# Patient Record
Sex: Male | Born: 1945 | Race: White | Hispanic: No | Marital: Married | Smoking: Never smoker
Health system: Southern US, Community
[De-identification: ages and names within clinical notes are randomized; demographics above are authoritative.]

## PROBLEM LIST (undated history)

## (undated) DIAGNOSIS — E785 Hyperlipidemia, unspecified: Secondary | ICD-10-CM

## (undated) DIAGNOSIS — I639 Cerebral infarction, unspecified: Secondary | ICD-10-CM

## (undated) DIAGNOSIS — I10 Essential (primary) hypertension: Secondary | ICD-10-CM

## (undated) DIAGNOSIS — D4102 Neoplasm of uncertain behavior of left kidney: Secondary | ICD-10-CM

## (undated) DIAGNOSIS — M109 Gout, unspecified: Secondary | ICD-10-CM

## (undated) DIAGNOSIS — N289 Disorder of kidney and ureter, unspecified: Secondary | ICD-10-CM

## (undated) DIAGNOSIS — F329 Major depressive disorder, single episode, unspecified: Secondary | ICD-10-CM

## (undated) HISTORY — PX: KIDNEY SURGERY: SHX687

---

## 2011-09-09 ENCOUNTER — Other Ambulatory Visit: Payer: Self-pay | Admitting: Family Medicine

## 2011-09-09 DIAGNOSIS — R1011 Right upper quadrant pain: Secondary | ICD-10-CM

## 2011-09-13 ENCOUNTER — Other Ambulatory Visit: Payer: Self-pay

## 2011-09-16 ENCOUNTER — Ambulatory Visit
Admission: RE | Admit: 2011-09-16 | Discharge: 2011-09-16 | Disposition: A | Discharge status: Home / Self Care | Payer: Medicare Other | Source: Ambulatory Visit | Attending: Family Medicine | Admitting: Family Medicine

## 2011-09-16 DIAGNOSIS — R1011 Right upper quadrant pain: Secondary | ICD-10-CM

## 2015-01-14 ENCOUNTER — Other Ambulatory Visit (HOSPITAL_COMMUNITY): Payer: Self-pay | Admitting: Family Medicine

## 2015-01-14 ENCOUNTER — Ambulatory Visit (HOSPITAL_COMMUNITY)
Admission: RE | Admit: 2015-01-14 | Discharge: 2015-01-14 | Disposition: A | Discharge status: Home / Self Care | Payer: Medicare Other | Source: Ambulatory Visit | Attending: Family Medicine | Admitting: Family Medicine

## 2015-01-14 DIAGNOSIS — R52 Pain, unspecified: Secondary | ICD-10-CM | POA: Diagnosis not present

## 2015-01-14 DIAGNOSIS — M79661 Pain in right lower leg: Secondary | ICD-10-CM | POA: Insufficient documentation

## 2015-01-14 NOTE — Progress Notes (Signed)
Preliminary results by tech - Right Lower Ext. Venous Duplex Completed. Negative for deep and superficial vein thrombosis in the right leg.  Britnie Colville, BS, RDMS, RVT  

## 2015-04-10 ENCOUNTER — Other Ambulatory Visit: Payer: Self-pay | Admitting: Family Medicine

## 2015-04-10 DIAGNOSIS — R1084 Generalized abdominal pain: Secondary | ICD-10-CM

## 2015-04-28 ENCOUNTER — Ambulatory Visit
Admission: RE | Admit: 2015-04-28 | Discharge: 2015-04-28 | Disposition: A | Discharge status: Home / Self Care | Payer: Medicare Other | Source: Ambulatory Visit | Attending: Family Medicine | Admitting: Family Medicine

## 2015-04-28 DIAGNOSIS — R1084 Generalized abdominal pain: Secondary | ICD-10-CM

## 2015-07-02 ENCOUNTER — Ambulatory Visit
Admission: RE | Admit: 2015-07-02 | Discharge: 2015-07-02 | Disposition: A | Discharge status: Home / Self Care | Payer: Medicare Other | Source: Ambulatory Visit | Attending: Family Medicine | Admitting: Family Medicine

## 2015-07-02 ENCOUNTER — Other Ambulatory Visit: Payer: Self-pay | Admitting: Family Medicine

## 2015-07-02 DIAGNOSIS — R52 Pain, unspecified: Secondary | ICD-10-CM

## 2015-07-02 DIAGNOSIS — M5430 Sciatica, unspecified side: Secondary | ICD-10-CM

## 2015-08-04 ENCOUNTER — Other Ambulatory Visit: Payer: Self-pay | Admitting: Family Medicine

## 2015-08-04 DIAGNOSIS — M5430 Sciatica, unspecified side: Secondary | ICD-10-CM

## 2015-08-12 ENCOUNTER — Ambulatory Visit
Admission: RE | Admit: 2015-08-12 | Discharge: 2015-08-12 | Disposition: A | Discharge status: Home / Self Care | Payer: Medicare Other | Source: Ambulatory Visit | Attending: Family Medicine | Admitting: Family Medicine

## 2015-08-12 DIAGNOSIS — M5430 Sciatica, unspecified side: Secondary | ICD-10-CM

## 2016-01-21 ENCOUNTER — Encounter (HOSPITAL_COMMUNITY): Payer: Self-pay | Admitting: Emergency Medicine

## 2016-01-21 ENCOUNTER — Emergency Department (HOSPITAL_COMMUNITY)
Admission: EM | Admit: 2016-01-21 | Discharge: 2016-01-21 | Disposition: A | Discharge status: Home / Self Care | Payer: Medicare Other | Attending: Dermatology | Admitting: Dermatology

## 2016-01-21 DIAGNOSIS — Y939 Activity, unspecified: Secondary | ICD-10-CM | POA: Insufficient documentation

## 2016-01-21 DIAGNOSIS — Z5321 Procedure and treatment not carried out due to patient leaving prior to being seen by health care provider: Secondary | ICD-10-CM | POA: Insufficient documentation

## 2016-01-21 DIAGNOSIS — Y999 Unspecified external cause status: Secondary | ICD-10-CM | POA: Insufficient documentation

## 2016-01-21 DIAGNOSIS — I10 Essential (primary) hypertension: Secondary | ICD-10-CM | POA: Insufficient documentation

## 2016-01-21 DIAGNOSIS — T24031A Burn of unspecified degree of right lower leg, initial encounter: Secondary | ICD-10-CM | POA: Diagnosis not present

## 2016-01-21 DIAGNOSIS — X19XXXA Contact with other heat and hot substances, initial encounter: Secondary | ICD-10-CM | POA: Insufficient documentation

## 2016-01-21 DIAGNOSIS — Y92007 Garden or yard of unspecified non-institutional (private) residence as the place of occurrence of the external cause: Secondary | ICD-10-CM | POA: Diagnosis not present

## 2016-01-21 HISTORY — DX: Essential (primary) hypertension: I10

## 2016-01-21 NOTE — ED Notes (Signed)
Attempted to call patient for assigned room with no answer.  
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
## 2018-10-20 ENCOUNTER — Encounter (HOSPITAL_COMMUNITY): Payer: Self-pay | Admitting: Emergency Medicine

## 2018-10-20 ENCOUNTER — Observation Stay (HOSPITAL_COMMUNITY)
Admission: EM | Admit: 2018-10-20 | Discharge: 2018-10-22 | Disposition: A | Discharge status: Home / Self Care | Payer: Medicare Other | Attending: Family Medicine | Admitting: Family Medicine

## 2018-10-20 ENCOUNTER — Emergency Department (HOSPITAL_COMMUNITY): Payer: Medicare Other

## 2018-10-20 ENCOUNTER — Other Ambulatory Visit: Payer: Self-pay

## 2018-10-20 DIAGNOSIS — Z20828 Contact with and (suspected) exposure to other viral communicable diseases: Secondary | ICD-10-CM | POA: Insufficient documentation

## 2018-10-20 DIAGNOSIS — R29810 Facial weakness: Secondary | ICD-10-CM | POA: Diagnosis present

## 2018-10-20 DIAGNOSIS — I160 Hypertensive urgency: Secondary | ICD-10-CM | POA: Diagnosis present

## 2018-10-20 DIAGNOSIS — Z79899 Other long term (current) drug therapy: Secondary | ICD-10-CM | POA: Insufficient documentation

## 2018-10-20 DIAGNOSIS — I10 Essential (primary) hypertension: Secondary | ICD-10-CM | POA: Insufficient documentation

## 2018-10-20 DIAGNOSIS — I639 Cerebral infarction, unspecified: Secondary | ICD-10-CM | POA: Diagnosis present

## 2018-10-20 DIAGNOSIS — I6350 Cerebral infarction due to unspecified occlusion or stenosis of unspecified cerebral artery: Secondary | ICD-10-CM | POA: Diagnosis not present

## 2018-10-20 DIAGNOSIS — R2990 Unspecified symptoms and signs involving the nervous system: Secondary | ICD-10-CM

## 2018-10-20 LAB — CBC WITH DIFFERENTIAL/PLATELET
Abs Immature Granulocytes: 0.05 10*3/uL (ref 0.00–0.07)
Abs Immature Granulocytes: 0.07 10*3/uL (ref 0.00–0.07)
Basophils Absolute: 0.1 10*3/uL (ref 0.0–0.1)
Basophils Absolute: 0.1 10*3/uL (ref 0.0–0.1)
Basophils Relative: 1 %
Basophils Relative: 1 %
Eosinophils Absolute: 0.3 10*3/uL (ref 0.0–0.5)
Eosinophils Absolute: 0.3 10*3/uL (ref 0.0–0.5)
Eosinophils Relative: 3 %
Eosinophils Relative: 3 %
HCT: 48.4 % (ref 39.0–52.0)
HCT: 48.8 % (ref 39.0–52.0)
Hemoglobin: 16.3 g/dL (ref 13.0–17.0)
Hemoglobin: 16.4 g/dL (ref 13.0–17.0)
Immature Granulocytes: 1 %
Immature Granulocytes: 1 %
Lymphocytes Relative: 23 %
Lymphocytes Relative: 23 %
Lymphs Abs: 2.1 10*3/uL (ref 0.7–4.0)
Lymphs Abs: 2.1 10*3/uL (ref 0.7–4.0)
MCH: 30.1 pg (ref 26.0–34.0)
MCH: 30.4 pg (ref 26.0–34.0)
MCHC: 33.6 g/dL (ref 30.0–36.0)
MCHC: 33.7 g/dL (ref 30.0–36.0)
MCV: 89.3 fL (ref 80.0–100.0)
MCV: 90.4 fL (ref 80.0–100.0)
Monocytes Absolute: 0.7 10*3/uL (ref 0.1–1.0)
Monocytes Absolute: 0.8 10*3/uL (ref 0.1–1.0)
Monocytes Relative: 8 %
Monocytes Relative: 8 %
Neutro Abs: 5.7 10*3/uL (ref 1.7–7.7)
Neutro Abs: 5.9 10*3/uL (ref 1.7–7.7)
Neutrophils Relative %: 64 %
Neutrophils Relative %: 64 %
Platelets: 306 10*3/uL (ref 150–400)
Platelets: 313 10*3/uL (ref 150–400)
RBC: 5.4 MIL/uL (ref 4.22–5.81)
RBC: 5.42 MIL/uL (ref 4.22–5.81)
RDW: 13.4 % (ref 11.5–15.5)
RDW: 13.6 % (ref 11.5–15.5)
WBC: 8.9 10*3/uL (ref 4.0–10.5)
WBC: 9.2 10*3/uL (ref 4.0–10.5)
nRBC: 0 % (ref 0.0–0.2)
nRBC: 0 % (ref 0.0–0.2)

## 2018-10-20 LAB — ETHANOL: Alcohol, Ethyl (B): <10 mg/dL (ref <10)

## 2018-10-20 LAB — URINALYSIS, ROUTINE W REFLEX MICROSCOPIC
Bacteria, UA: NONE SEEN
Bilirubin Urine: NEGATIVE
Glucose, UA: NEGATIVE mg/dL
Hgb urine dipstick: NEGATIVE
Ketones, ur: NEGATIVE mg/dL
Leukocytes,Ua: NEGATIVE
Nitrite: NEGATIVE
Protein, ur: 100 mg/dL — AB
Specific Gravity, Urine: 1.02 (ref 1.005–1.030)
pH: 5 (ref 5.0–8.0)

## 2018-10-20 LAB — BASIC METABOLIC PANEL
Anion gap: 10 (ref 5–15)
Anion gap: 10 (ref 5–15)
BUN: 18 mg/dL (ref 8–23)
BUN: 19 mg/dL (ref 8–23)
CO2: 24 mmol/L (ref 22–32)
CO2: 24 mmol/L (ref 22–32)
Calcium: 9.3 mg/dL (ref 8.9–10.3)
Calcium: 9.4 mg/dL (ref 8.9–10.3)
Chloride: 103 mmol/L (ref 98–111)
Chloride: 103 mmol/L (ref 98–111)
Creatinine, Ser: 1.38 mg/dL — ABNORMAL HIGH (ref 0.61–1.24)
Creatinine, Ser: 1.58 mg/dL — ABNORMAL HIGH (ref 0.61–1.24)
GFR calc Af Amer: 49 mL/min — ABNORMAL LOW (ref >60)
GFR calc Af Amer: 58 mL/min — ABNORMAL LOW (ref >60)
GFR calc non Af Amer: 42 mL/min — ABNORMAL LOW (ref >60)
GFR calc non Af Amer: 50 mL/min — ABNORMAL LOW (ref >60)
Glucose, Bld: 91 mg/dL (ref 70–99)
Glucose, Bld: 94 mg/dL (ref 70–99)
Potassium: 4.5 mmol/L (ref 3.5–5.1)
Potassium: 4.7 mmol/L (ref 3.5–5.1)
Sodium: 137 mmol/L (ref 135–145)
Sodium: 137 mmol/L (ref 135–145)

## 2018-10-20 LAB — MAGNESIUM
Magnesium: 2.3 mg/dL (ref 1.7–2.4)
Magnesium: 2.3 mg/dL (ref 1.7–2.4)

## 2018-10-20 MED ORDER — ASPIRIN 81 MG PO CHEW
324.0000 mg | CHEWABLE_TABLET | Freq: Once | ORAL | Status: AC
  Administered 2018-10-20: 20:00:00 324 mg via ORAL
  Filled 2018-10-20: qty 4

## 2018-10-20 NOTE — ED Notes (Signed)
Spoke with Santiago Glad, Agricultural consultant at [employer]. Pt will be transported to [facility] for MRI. Dr. Wilson Singer spoke with Dr. Gilford Raid as accepting.
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
## 2018-10-20 NOTE — ED Notes (Addendum)
Pt states he started feeling confused at 0730 this morning. Headache and facial numbness began Monday, however, pt also states he thought today was Monday.

## 2018-10-20 NOTE — ED Notes (Signed)
ED Provider has been made aware of patient symptoms.

## 2018-10-20 NOTE — ED Provider Notes (Signed)
[place] DEPT Provider Note   CSN: [id number] Arrival date & time: 10/20/18  1627     History   Chief Complaint Chief Complaint  Patient presents with  . Facial Droop    HPI  Ristine is a 73 y.o. male.     HPI   43yM with stroke like symptoms. L facial droop, intermittent LUE numbness and intermittent difficulty with recall. Unclear exact symptom onset. Differing times provided by both patient and his wife. Possibly as long as two weeks ago he noticed abnormal sensation in L face. He didn't feel like it was anything that needed evaluation though. Resolved. Symptoms again a couple days later but is vague on exact timeline. Also then noticed numbness in L arm down through his fingers. Maybe some mild L arm weakness. Wife has noticed that he has seemed mildly confused and needing frequent reorientation to the date in the past week. This is unusual for him. She has also noticed that L face has been drooping. Trying to pin times down is difficult for either of them. No change in voice. No acute visual changes. No problems with balance. Has been having intermittent frontal headaches in recently. Past hx of HTN.    Past Medical History:  Diagnosis Date  . Hypertension    There are no active problems to display for this patient.  Past Surgical History:  Procedure Laterality Date  . KIDNEY SURGERY        Home Medications    Prior to Admission medications   Medication Sig Start Date End Date Taking? Authorizing Provider  colchicine 0.6 MG tablet Take 0.6 mg by mouth daily as needed (gout).   Yes [provider]  metoprolol succinate (TOPROL-XL) 100 MG 24 hr tablet Take 100 mg by mouth daily. 07/21/18  Yes [provider]    Family History No family history on file.  Social History Social History   Tobacco Use  . Smoking status: Never Smoker  . Smokeless tobacco: Never Used  Substance Use Topics  . Alcohol use: Not on  file  . Drug use: Not on file    Allergies   Prednisone and Keflex [cephalexin]   Review of Systems Review of Systems All systems reviewed and negative, other than as noted in HPI.   Physical Exam Updated Vital Signs BP (!) 169/120   Pulse 68   Temp 98 F (36.7 C) (Oral)   Resp 16   SpO2 97%   Physical Exam Vitals signs and nursing note reviewed.  Constitutional:      General: He is not in acute distress.    Appearance: He is well-developed.  HENT:     Head: Normocephalic and atraumatic.  Eyes:     General:        Right eye: No discharge.        Left eye: No discharge.     Conjunctiva/sclera: Conjunctivae normal.  Neck:     Musculoskeletal: Neck supple.  Cardiovascular:     Rate and Rhythm: Normal rate and regular rhythm.     Heart sounds: Normal heart sounds. No murmur. No friction rub. No gallop.   Pulmonary:     Effort: Pulmonary effort is normal. No respiratory distress.     Breath sounds: Normal breath sounds.  Abdominal:     General: There is no distension.     Palpations: Abdomen is soft.     Tenderness: There is no abdominal tenderness.  Musculoskeletal:  General: No tenderness.  Skin:    General: Skin is warm and dry.  Neurological:     Mental Status: He is alert.     Comments: Speech somewhat slow, but clear. AOx3. L facial droop. CN 2-12 otherwise intact. Strength 5/5 b/l u/l ext.   Psychiatric:        Behavior: Behavior normal.        Thought Content: Thought content normal.    ED Treatments / Results  Labs (all labs ordered are listed, but only abnormal results are displayed) Labs Reviewed  BASIC METABOLIC PANEL - Abnormal; Notable for the following components:      Result Value   Creatinine, Ser 1.58 (*)    GFR calc non Af Amer 42 (*)    GFR calc Af Amer 49 (*)    All other components within normal limits  URINALYSIS, ROUTINE W REFLEX MICROSCOPIC - Abnormal; Notable for the following components:   Protein, ur 100 (*)    All  other components within normal limits  BASIC METABOLIC PANEL - Abnormal; Notable for the following components:   Creatinine, Ser 1.38 (*)    GFR calc non Af Amer 50 (*)    GFR calc Af Amer 58 (*)    All other components within normal limits  CBC WITH DIFFERENTIAL/PLATELET  MAGNESIUM  ETHANOL  CBC WITH DIFFERENTIAL/PLATELET  MAGNESIUM    EKG EKG Interpretation  Date/Time:  Friday October 20 2018 16:54:03 EDT Ventricular Rate:  71 PR Interval:    QRS Duration: 91 QT Interval:  412 QTC Calculation: 448 R Axis:   -27 Text Interpretation:  Sinus rhythm Probable left atrial enlargement  left axis deviation Abnormal R-wave progression, early transition Repol abnrm suggests ischemia, lateral leads No old tracing to compare Confirmed by Virgel Manifold [phone]) on 10/20/2018 8:05:33 PM   Radiology Ct Head Wo Contrast  Result Date: 10/20/2018 CLINICAL DATA:  73 year old male with headache facial numbness for 4 days. Confusion for 1 day. EXAM: CT HEAD WITHOUT CONTRAST TECHNIQUE: Contiguous axial images were obtained from the base of the skull through the vertex without intravenous contrast. COMPARISON:  None. FINDINGS: Brain: An age indeterminate infarct within the RIGHT basal ganglia/genu of the internal capsule may be subacute. There is no evidence of hemorrhage. 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 There is no evidence of midline shift, mass, extra-axial collection or hydrocephalus. Vascular: Atherosclerotic calcifications noted. Skull: Normal. Negative for fracture or focal lesion. Sinuses/Orbits: No acute finding. Other: None. IMPRESSION: 1. Age indeterminate RIGHT basal ganglia/internal capsule which may be subacute. No evidence of hemorrhage. 2. 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 Electronically Signed   By: Margarette Canada M.D.   On:  10/20/2018 18:44    Procedures Procedures (including critical care time)  Medications Ordered in ED Medications - No data to display   Initial Impression / Assessment and Plan / ED Course  I have reviewed the triage vital signs and the nursing notes.  Pertinent labs & imaging results that were available during my care of the patient were reviewed by me and considered in my medical decision making (see chart for details).     CT equivocal for old versus subacute infarct. Pt cannot give clear timeline. Discussed with neurology. Cannot obtainn MRI at [facility] unfortunately. Transfer to [facility] ED for MR. If positive for acute CVA then consult neurology. If doesn't, then outpt studies  and neurology FU. Discussed with Dr Gilford Raid. A daughter now at bedside. She/pt updated on plan. No new complaints.   Final Clinical Impressions(s) / ED Diagnoses   Final diagnoses:  Stroke-like symptoms    ED Discharge Orders    None       Virgel Manifold, MD 10/20/18 2006

## 2018-10-21 ENCOUNTER — Observation Stay (HOSPITAL_COMMUNITY): Payer: Medicare Other

## 2018-10-21 ENCOUNTER — Observation Stay (HOSPITAL_BASED_OUTPATIENT_CLINIC_OR_DEPARTMENT_OTHER): Payer: Medicare Other

## 2018-10-21 ENCOUNTER — Emergency Department (HOSPITAL_COMMUNITY): Payer: Medicare Other

## 2018-10-21 ENCOUNTER — Encounter (HOSPITAL_COMMUNITY): Payer: Self-pay | Admitting: Internal Medicine

## 2018-10-21 DIAGNOSIS — Z79899 Other long term (current) drug therapy: Secondary | ICD-10-CM | POA: Diagnosis not present

## 2018-10-21 DIAGNOSIS — I639 Cerebral infarction, unspecified: Secondary | ICD-10-CM

## 2018-10-21 DIAGNOSIS — R29810 Facial weakness: Secondary | ICD-10-CM | POA: Diagnosis present

## 2018-10-21 DIAGNOSIS — I160 Hypertensive urgency: Secondary | ICD-10-CM

## 2018-10-21 DIAGNOSIS — Z20828 Contact with and (suspected) exposure to other viral communicable diseases: Secondary | ICD-10-CM | POA: Diagnosis not present

## 2018-10-21 DIAGNOSIS — I10 Essential (primary) hypertension: Secondary | ICD-10-CM | POA: Diagnosis not present

## 2018-10-21 DIAGNOSIS — I6350 Cerebral infarction due to unspecified occlusion or stenosis of unspecified cerebral artery: Secondary | ICD-10-CM | POA: Diagnosis not present

## 2018-10-21 LAB — COMPREHENSIVE METABOLIC PANEL
ALT: 18 U/L (ref 0–44)
AST: 22 U/L (ref 15–41)
Albumin: 3.4 g/dL — ABNORMAL LOW (ref 3.5–5.0)
Alkaline Phosphatase: 76 U/L (ref 38–126)
Anion gap: 13 (ref 5–15)
BUN: 16 mg/dL (ref 8–23)
CO2: 22 mmol/L (ref 22–32)
Calcium: 8.7 mg/dL — ABNORMAL LOW (ref 8.9–10.3)
Chloride: 102 mmol/L (ref 98–111)
Creatinine, Ser: 1.39 mg/dL — ABNORMAL HIGH (ref 0.61–1.24)
GFR calc Af Amer: 57 mL/min — ABNORMAL LOW (ref >60)
GFR calc non Af Amer: 50 mL/min — ABNORMAL LOW (ref >60)
Glucose, Bld: 140 mg/dL — ABNORMAL HIGH (ref 70–99)
Potassium: 3.6 mmol/L (ref 3.5–5.1)
Sodium: 137 mmol/L (ref 135–145)
Total Bilirubin: 0.9 mg/dL (ref 0.3–1.2)
Total Protein: 5.8 g/dL — ABNORMAL LOW (ref 6.5–8.1)

## 2018-10-21 LAB — CBC
HCT: 43.7 % (ref 39.0–52.0)
Hemoglobin: 14.9 g/dL (ref 13.0–17.0)
MCH: 30.2 pg (ref 26.0–34.0)
MCHC: 34.1 g/dL (ref 30.0–36.0)
MCV: 88.5 fL (ref 80.0–100.0)
Platelets: 257 10*3/uL (ref 150–400)
RBC: 4.94 MIL/uL (ref 4.22–5.81)
RDW: 13.4 % (ref 11.5–15.5)
WBC: 7.7 10*3/uL (ref 4.0–10.5)
nRBC: 0 % (ref 0.0–0.2)

## 2018-10-21 LAB — ECHOCARDIOGRAM COMPLETE
Height: 66 in
Weight: 3039.98 oz

## 2018-10-21 LAB — HEMOGLOBIN A1C
Hgb A1c MFr Bld: 5.1 % (ref 4.8–5.6)
Mean Plasma Glucose: 99.67 mg/dL

## 2018-10-21 LAB — SARS CORONAVIRUS 2 (TAT 6-24 HRS): SARS Coronavirus 2: NEGATIVE

## 2018-10-21 LAB — LIPID PANEL
Cholesterol: 172 mg/dL (ref 0–200)
HDL: 36 mg/dL — ABNORMAL LOW (ref >40)
LDL Cholesterol: 116 mg/dL — ABNORMAL HIGH (ref 0–99)
Total CHOL/HDL Ratio: 4.8 RATIO
Triglycerides: 101 mg/dL (ref <150)
VLDL: 20 mg/dL (ref 0–40)

## 2018-10-21 LAB — TROPONIN I (HIGH SENSITIVITY): Troponin I (High Sensitivity): 7 ng/L (ref <18)

## 2018-10-21 LAB — SEDIMENTATION RATE: Sed Rate: 3 mm/hr (ref 0–16)

## 2018-10-21 MED ORDER — HYDRALAZINE HCL 20 MG/ML IJ SOLN
10.0000 mg | INTRAMUSCULAR | Status: DC | PRN
  Administered 2018-10-21: 10 mg via INTRAVENOUS
  Filled 2018-10-21: qty 1

## 2018-10-21 MED ORDER — STROKE: EARLY STAGES OF RECOVERY BOOK
Freq: Once | Status: DC
  Filled 2018-10-21 (×2): qty 1

## 2018-10-21 MED ORDER — ASPIRIN 325 MG PO TABS
325.0000 mg | ORAL_TABLET | Freq: Every day | ORAL | Status: DC
  Administered 2018-10-21 – 2018-10-22 (×2): 325 mg via ORAL
  Filled 2018-10-21 (×2): qty 1

## 2018-10-21 MED ORDER — METOPROLOL SUCCINATE ER 100 MG PO TB24
100.0000 mg | ORAL_TABLET | Freq: Every day | ORAL | Status: DC
  Administered 2018-10-21: 100 mg via ORAL
  Filled 2018-10-21: qty 1

## 2018-10-21 MED ORDER — ACETAMINOPHEN 160 MG/5ML PO SOLN: 650.0000 mg | ORAL | Status: DC | PRN

## 2018-10-21 MED ORDER — ASPIRIN 300 MG RE SUPP: 300.0000 mg | Freq: Every day | RECTAL | Status: DC

## 2018-10-21 MED ORDER — SODIUM CHLORIDE 0.9 % IV SOLN: INTRAVENOUS | Status: AC

## 2018-10-21 MED ORDER — ENOXAPARIN SODIUM 40 MG/0.4ML ~~LOC~~ SOLN
40.0000 mg | Freq: Every day | SUBCUTANEOUS | Status: DC
  Administered 2018-10-21 – 2018-10-22 (×2): 40 mg via SUBCUTANEOUS
  Filled 2018-10-21 (×2): qty 0.4

## 2018-10-21 MED ORDER — ACETAMINOPHEN 650 MG RE SUPP: 650.0000 mg | RECTAL | Status: DC | PRN

## 2018-10-21 MED ORDER — ATORVASTATIN CALCIUM 80 MG PO TABS
80.0000 mg | ORAL_TABLET | Freq: Every day | ORAL | Status: DC
  Administered 2018-10-21: 80 mg via ORAL
  Filled 2018-10-21: qty 1

## 2018-10-21 MED ORDER — CARVEDILOL 12.5 MG PO TABS
12.5000 mg | ORAL_TABLET | Freq: Two times a day (BID) | ORAL | Status: DC
  Administered 2018-10-21 – 2018-10-22 (×2): 12.5 mg via ORAL
  Filled 2018-10-21 (×2): qty 1

## 2018-10-21 MED ORDER — ATORVASTATIN CALCIUM 40 MG PO TABS: 40.0000 mg | ORAL_TABLET | Freq: Every day | ORAL | Status: DC

## 2018-10-21 MED ORDER — ACETAMINOPHEN 325 MG PO TABS
650.0000 mg | ORAL_TABLET | ORAL | Status: DC | PRN
  Administered 2018-10-21: 650 mg via ORAL
  Filled 2018-10-21: qty 2

## 2018-10-21 MED ORDER — AMLODIPINE BESYLATE 2.5 MG PO TABS
2.5000 mg | ORAL_TABLET | Freq: Every day | ORAL | Status: DC
  Administered 2018-10-21 – 2018-10-22 (×2): 2.5 mg via ORAL
  Filled 2018-10-21 (×2): qty 1

## 2018-10-21 NOTE — Evaluation (Signed)
Physical Therapy Evaluation Patient Details Name: John Joseph MRN: [id number] DOB: 29-Aug-1945 Today's Date: 10/21/2018   History of Present Illness   John Joseph is a 73 y.o. male with history of hypertension was brought to the ER after patient's wife noticed that patient was having left facial droop.  Patient states over the last 2 weeks he has been having frontal headache and increased confusion. 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   Clinical Impression  Pt admitted with above diagnosis. Pt reports that the L sided HA he has had for past month is resolved and he feels more alert than he was PTA. Pt independent with bed mobility and transfers, ambulated without LOB, but shows decreased awareness of deficits as well as getting confused directionally and needing cues to find room. Will follow acutely to work on cognitive tasks in combination with mobility.  Pt currently with functional limitations due to the deficits listed below (see PT Problem List). Pt will benefit from skilled PT to increase their independence and safety with mobility to allow discharge to the venue listed below.       Follow Up Recommendations No PT follow up    Equipment Recommendations  None recommended by PT    Recommendations for Other Services Speech consult     Precautions / Restrictions Precautions Precautions: Fall Restrictions Weight Bearing Restrictions: No      Mobility  Bed Mobility Overal bed mobility: Independent                Transfers Overall transfer level: Modified independent Equipment used: None                Ambulation/Gait Ambulation/Gait assistance: Modified independent (Device/Increase time) Gait Distance (Feet): 300 Feet Assistive device: None Gait Pattern/deviations: WFL(Within Functional Limits) Gait velocity: WFL Gait velocity interpretation: >2.62 ft/sec, indicative of community ambulatory General Gait Details: pt ambulated safely  but is easily distracted by environment and got very turned around on unit.   Stairs Stairs: Yes Stairs assistance: Supervision Stair Management: One rail Right;Alternating pattern;Forwards Number of Stairs: 10 General stair comments: HR 89-99 bpm on stairs, no LOB with use of rail  Wheelchair Mobility    Modified Rankin (Stroke Patients Only) Modified Rankin (Stroke Patients Only) Pre-Morbid Rankin Score: No significant disability Modified Rankin: Slight disability     Balance Overall balance assessment: Mild deficits observed, not formally tested                                           Pertinent Vitals/Pain Pain Assessment: No/denies pain    Home Living Family/patient expects to be discharged to:: Private residence Living Arrangements: Spouse/significant other Available Help at Discharge: Family;Available 24 hours/day Type of Home: House Home Access: Stairs to enter Entrance Stairs-Rails: None Entrance Stairs-Number of Steps: 3 Home Layout: Two level Home Equipment: Wheelchair - manual      Prior Function Level of Independence: Independent         Comments: 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: Right    Extremity/Trunk Assessment   Upper Extremity Assessment Upper Extremity Assessment: Defer to OT evaluation    Lower Extremity Assessment Lower Extremity Assessment: Overall WFL for tasks assessed    Cervical / Trunk Assessment Cervical / Trunk Assessment: Normal  Communication   Communication:  Expressive difficulties  Cognition Arousal/Alertness: Awake/alert Behavior During Therapy: WFL for tasks assessed/performed Overall Cognitive Status: Impaired/Different from baseline Area of Impairment: Memory;Problem solving;Safety/judgement;Following commands;Attention                   Current Attention Level: Sustained Memory: Decreased short-term memory Following  Commands: Follows one step commands consistently;Follows multi-step commands with increased time Safety/Judgement: Decreased awareness of safety;Decreased awareness of deficits   Problem Solving: Slow processing;Difficulty sequencing;Requires verbal cues General Comments: pt repeats self and asks same questions several times, got very disoriented while ambulating on unit with directions needed to get back to room, slow processing noted as well      General Comments General comments (skin integrity, edema, etc.): pt reports frequent L headache past month and increasing "Brain fog" that seems somewhat better today    Exercises     Assessment/Plan    PT Assessment Patient needs continued PT services  PT Problem List Decreased cognition;Decreased safety awareness       PT Treatment Interventions Gait training;Cognitive remediation;Therapeutic exercise;Balance training;Functional mobility training;Stair training;Patient/family education    PT Goals (Current goals can be found in the Care Plan section)  Acute Rehab PT Goals Patient Stated Goal: go home PT Goal Formulation: With patient Time For Goal Achievement: 11/04/18 Potential to Achieve Goals: Good Additional Goals Additional Goal #1: Pt to perform >47 on Berg balance Additional Goal #2: Pt will be able to walk the 3W unit and remember his room number and find his way back to his room independently    Frequency Min 4X/week   Barriers to discharge        Co-evaluation               AM-PAC PT "6 Clicks" Mobility  Outcome Measure Help needed turning from your back to your side while in a flat bed without using bedrails?: None Help needed moving from lying on your back to sitting on the side of a flat bed without using bedrails?: None Help needed moving to and from a bed to a chair (including a wheelchair)?: None Help needed standing up from a chair using your arms (e.g., wheelchair or bedside chair)?: None Help needed  to walk in hospital room?: None Help needed climbing 3-5 steps with a railing? : A Little 6 Click Score: 23    End of Session   Activity Tolerance: Patient tolerated treatment well Patient left: in bed;with call bell/phone within reach Nurse Communication: Mobility status PT Visit Diagnosis: Apraxia (R48.2)    Time: [id number] PT Time Calculation (min) (ACUTE ONLY): 29 min   Charges:   PT Evaluation $PT Eval Moderate Complexity: 1 Mod PT Treatments $Gait Training: 8-22 mins        Leighton Roach, PT  Acute Rehab Services  Pager [phone] Office [place] 10/21/2018, 1:11 PM

## 2018-10-21 NOTE — ED Provider Notes (Signed)
12:51 AM Assumed care from Dr. Wilson Singer, please see their note for full history, physical and decision making until this point. In brief this is a 73 y.o. year old male who presented to the ED tonight with Facial Droop     Apparently new l sided facial droop. Patient states he can't smile unless something is funny but does appear to have mildly depressed left smile.  Per previous provider note, had spoken to neurology and plan for MRI. If acute stroke, admit. If not, dc home with outpatient follow up.  Dr. 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 Will allow perimssive HTN. He will see for formal consult, recommends medicine admission. Will consult.  Labs, studies and imaging reviewed by myself and considered in medical decision making if ordered. Imaging interpreted by radiology.  Labs Reviewed  BASIC METABOLIC PANEL - Abnormal; Notable for the following components:      Result Value   Creatinine, Ser 1.58 (*)    GFR calc non Af Amer 42 (*)    GFR calc Af Amer 49 (*)    All other components within normal limits  URINALYSIS, ROUTINE W REFLEX MICROSCOPIC - Abnormal; Notable for the following components:   Protein, ur 100 (*)    All other components within normal limits  BASIC METABOLIC PANEL - Abnormal; Notable for the following components:   Creatinine, Ser 1.38 (*)    GFR calc non Af Amer 50 (*)    GFR calc Af Amer 58 (*)    All other components within normal limits  CBC WITH DIFFERENTIAL/PLATELET  MAGNESIUM  ETHANOL  CBC WITH DIFFERENTIAL/PLATELET  MAGNESIUM    CT Head Wo Contrast  Final Result    MR BRAIN WO CONTRAST    (Results Pending)    No follow-ups on file.    Eulene Pekar, Corene Cornea, MD 10/21/18 [phone]

## 2018-10-21 NOTE — ED Notes (Signed)
SHARON RILEY (DAUGHTER)- [phone]. STATED SHE IS GOING HOME BUT WOULD LIKE TO BE CALLED FOR UPDATES AND INFORMED WHEN HE IS IN A ROOM.

## 2018-10-21 NOTE — Progress Notes (Signed)
PROGRESS NOTE    John Joseph  [id number] DOB: 1945/09/24 DOA: 10/20/2018 PCP: Jonathon Jordan, MD      Brief Narrative:  John Joseph is a 73 y.o. male with history of hypertension was brought to the ER after patient's wife noticed that patient was having left facial droop.  Patient states over the last 2 weeks he has been having frontal headache and increased confusion.  Yesterday morning the patient's wife noticed that patient had left facial droop and was brought to the ER.  Patient is a poor historian.  Denies any chest pain shortness of breath nausea vomiting diarrhea.  Denies any fever chills.  Denies any weakness of the upper or lower extremities.  Denies any visual symptoms.  ED Course: In the ER patient had mild left facial droop.  CT of the head was abnormal and on-call neurology was consulted and had MRI brain done which shows acute/subacute infarct involving the right basal ganglia area.  Patient passed swallow.  Blood pressure is markedly elevated with systolic in the [id number] when patient arrived.  Labs revealed creatinine of 1.5 no old labs to compare per EKG shows normal sinus rhythm with ST elevation in the lead I.  Discussed with cardiologist and since patient did not have any chest pain or any changes in other leads no further action required for this.   Assessment & Plan:  Stroke -Non-invasive angiography showed no significant atherosclerosis -Echocardiogram pending -Carotid imaging unremarkable   -Lipids ordered: on atorvastatin -Aspirin ordered at admission  -Continue Plavix -Atrial fibrillation: NOt on tele -tPA not given because out of stroke window -Dysphagia screen ordered in ER -PT eval ordered -Smoking cessation: not applicable  Dementia Neurology referral  [facility] Neurology referral  Hypertensive urgency Start amlodipine Change BB to carvedilol     DVT prophylaxis: Lovenox Code Status: FULL Family Communication: Wife MDM and disposition Plan:  This is a no charge note.  For further details, please see H&P by my partner Dr. Hal Hope from earlier today.  The below labs and imaging reports were reviewed and summarized above.    The patient was admitted with stroke    Objective: Vitals:   10/21/18 1100 10/21/18 1200 10/21/18 1400 10/21/18 1600  BP: (!) 180/113 (!) 156/89 (!) 142/99 (!) 157/101  Pulse: 77 97 96   Resp:      Temp: (!) 97 F (36.1 C) 98 F (36.7 C) (!) 97 F (36.1 C) 98 F (36.7 C)  TempSrc: Axillary Axillary Oral Axillary  SpO2: 98%  99% 95%  Weight:      Height:        Intake/Output Summary (Last 24 hours) at 10/21/2018 1855 Last data filed at 10/21/2018 0300 Gross per 24 hour  Intake 240 ml  Output -  Net 240 ml   Filed Weights   10/21/18 0600  Weight: 86.2 kg    Examination: The patient was seen and examined.      Data Reviewed: I have personally reviewed following labs and imaging studies:  CBC: Recent Labs  Lab 10/20/18 1655 10/21/18 0451  WBC 8.9  9.2 7.7  NEUTROABS 5.7  5.9  --   HGB 16.4  16.3 14.9  HCT 48.8  48.4 43.7  MCV 90.4  89.3 88.5  PLT 306  313 [id number]   Basic Metabolic Panel: Recent Labs  Lab 10/20/18 1655 10/21/18 0451  NA 137  137 137  K 4.7  4.5 3.6  CL 103  103 102  CO2 24  24 22  GLUCOSE 94  91 140*  BUN 19  18 16   CREATININE 1.38*  1.58* 1.39*  CALCIUM 9.4  9.3 8.7*  MG 2.3  2.3  --    GFR: Estimated Creatinine Clearance: 48 mL/min (A) (by C-G formula based on SCr of 1.39 mg/dL (H)). Liver Function Tests: Recent Labs  Lab 10/21/18 0451  AST 22  ALT 18  ALKPHOS 76  BILITOT 0.9  PROT 5.8*  ALBUMIN 3.4*   No results for input(s): LIPASE, AMYLASE in the last 168 hours. No results for input(s): AMMONIA in the last 168 hours. Coagulation Profile: No results for input(s): INR, PROTIME in the last 168 hours. Cardiac Enzymes: No results for input(s): CKTOTAL, CKMB, CKMBINDEX, TROPONINI in the last 168 hours. BNP (last 3 results) No  results for input(s): PROBNP in the last 8760 hours. HbA1C: Recent Labs    10/21/18 0451  HGBA1C 5.1   CBG: No results for input(s): GLUCAP in the last 168 hours. Lipid Profile: Recent Labs    10/21/18 0451  CHOL 172  HDL 36*  LDLCALC 116*  TRIG 101  CHOLHDL 4.8   Thyroid Function Tests: No results for input(s): TSH, T4TOTAL, FREET4, T3FREE, THYROIDAB in the last 72 hours. Anemia Panel: No results for input(s): VITAMINB12, FOLATE, FERRITIN, TIBC, IRON, RETICCTPCT in the last 72 hours. Urine analysis:    Component Value Date/Time   COLORURINE YELLOW 10/20/2018 1801   APPEARANCEUR CLEAR 10/20/2018 1801   LABSPEC 1.020 10/20/2018 1801   PHURINE 5.0 10/20/2018 1801   GLUCOSEU NEGATIVE 10/20/2018 1801   HGBUR NEGATIVE 10/20/2018 1801   BILIRUBINUR NEGATIVE 10/20/2018 [place] 10/20/2018 1801   PROTEINUR 100 (A) 10/20/2018 1801   NITRITE NEGATIVE 10/20/2018 1801   LEUKOCYTESUR NEGATIVE 10/20/2018 1801   Sepsis Labs: @LABRCNTIP (procalcitonin:4,lacticacidven:4)  ) Recent Results (from the past 240 hour(s))  SARS CORONAVIRUS 2 (TAT 6-12 HRS) Nasal Swab Aptima Multi Swab     Status: None   Collection Time: 10/21/18  2:43 AM   Specimen: Aptima Multi Swab; Nasal Swab  Result Value Ref Range Status   SARS Coronavirus 2 NEGATIVE NEGATIVE Final    Comment: (NOTE) SARS-CoV-2 target nucleic acids are NOT DETECTED. The SARS-CoV-2 RNA is generally detectable in upper and lower respiratory specimens during the acute phase of infection. Negative results do not preclude SARS-CoV-2 infection, do not rule out co-infections with other pathogens, and should not be used as the sole basis for treatment or other patient management decisions. Negative results must be combined with clinical observations, patient history, and epidemiological information. The expected result is Negative. Fact Sheet for Patients: SugarRoll.be Fact Sheet for  Healthcare Providers: https://www.woods-mathews.com/ This test is not yet approved or cleared by the [place] FDA and  has been authorized for detection and/or diagnosis of SARS-CoV-2 by FDA under an Emergency Use Authorization (EUA). This EUA will remain  in effect (meaning this test can be used) for the duration of the COVID-19 declaration under Section 56 4(b)(1) of the Act, 21 U.S.C. section 360bbb-3(b)(1), unless the authorization is terminated or revoked sooner. Performed at [facility], [place] [address], [place], [place] [address]          Radiology Studies: Ct Head Wo Contrast  Result Date: 10/20/2018 CLINICAL DATA:  73 year old male with headache facial numbness for 4 days. Confusion for 1 day. EXAM: CT HEAD WITHOUT CONTRAST TECHNIQUE: Contiguous axial images were obtained from the base of the skull through the vertex without intravenous contrast. COMPARISON:  None.  FINDINGS: Brain: An age indeterminate infarct within the RIGHT basal ganglia/genu of the internal capsule may be subacute. There is no evidence of hemorrhage. 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 There is no evidence of midline shift, mass, extra-axial collection or hydrocephalus. Vascular: Atherosclerotic calcifications noted. Skull: Normal. Negative for fracture or focal lesion. Sinuses/Orbits: No acute finding. Other: None. IMPRESSION: 1. Age indeterminate RIGHT basal ganglia/internal capsule which may be subacute. No evidence of hemorrhage. 2. 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 Electronically Signed   By: Margarette Canada M.D.   On: 10/20/2018 18:44   Mr Angio Head Wo Contrast  Result Date: 10/21/2018 CLINICAL DATA:  Acute infarction right basal ganglia.  Follow-up. EXAM: MRA HEAD WITHOUT CONTRAST TECHNIQUE: Angiographic images of the  Circle of Willis were obtained using MRA technique without intravenous contrast. COMPARISON:  MRI same day FINDINGS: Both internal carotid arteries are widely patent into the brain. No siphon stenosis. The anterior and middle cerebral vessels are patent without proximal stenosis, aneurysm or vascular malformation. Distal vessels do show some atherosclerotic irregularity. Both vertebral arteries are widely patent to the basilar. No basilar stenosis. Posterior circulation branch vessels appear normal. IMPRESSION: Normal examination of the intracranial large and medium sized vessels. Mild atherosclerotic irregularity of the distal branch vessels. Electronically Signed   By: Nelson Chimes M.D.   On: 10/21/2018 09:40   Mr Brain Wo Contrast  Result Date: 10/21/2018 CLINICAL DATA:  Confusion and difficulty speaking. EXAM: MRI HEAD WITHOUT CONTRAST TECHNIQUE: Multiplanar, multiecho pulse sequences of the brain and surrounding structures were obtained without intravenous contrast. COMPARISON:  Head CT 10/20/2018 FINDINGS: BRAIN: There is a small right basal ganglia infarct. No hemorrhage or mass effect. Multifocal white matter hyperintensity, most commonly due to chronic ischemic microangiopathy. There is generalized atrophy without lobar predilection. The midline structures are normal. VASCULAR: The major intracranial arterial and venous sinus flow voids are normal. Susceptibility-sensitive sequences show no chronic microhemorrhage or superficial siderosis. SKULL AND UPPER CERVICAL SPINE: Calvarial bone marrow signal is normal. There is no skull base mass. The visualized upper cervical spine and soft tissues are normal. SINUSES/ORBITS: There are no fluid levels or advanced mucosal thickening. The mastoid air cells and middle ear cavities are free of fluid. The orbits are normal. IMPRESSION: Small right basal ganglia acute/early subacute infarct without hemorrhage or mass effect. Electronically Signed   By: Ulyses Jarred  M.D.   On: 10/21/2018 02:15   Vas US Carotid (at [place] Only)  Result Date: 10/21/2018 Carotid Arterial Duplex Study Indications:       CVA and Speech disturbance. Risk Factors:      Hypertension. Other Factors:     Headache, uncontrolled hypertension. Hypertensive emergency. Comparison Study:  No prior study on file for comparison Performing Technologist: Sharion Dove RVS  Examination Guidelines: A complete evaluation includes B-mode imaging, spectral Doppler, color Doppler, and power Doppler as needed of all accessible portions of each vessel. Bilateral testing is considered an integral part of a complete examination. Limited examinations for reoccurring indications may be performed as noted.  Right Carotid Findings: +----------+--------+--------+--------+------------------+------------------+           PSV cm/sEDV cm/sStenosisPlaque DescriptionComments           +----------+--------+--------+--------+------------------+------------------+ CCA Prox  86      18  intimal thickening +----------+--------+--------+--------+------------------+------------------+ CCA Distal75      21                                intimal thickening +----------+--------+--------+--------+------------------+------------------+ ICA Prox  44      13                                                   +----------+--------+--------+--------+------------------+------------------+ ICA Distal53      19                                                   +----------+--------+--------+--------+------------------+------------------+ ECA       81      15                                                   +----------+--------+--------+--------+------------------+------------------+ +----------+--------+-------+--------+-------------------+           PSV cm/sEDV cmsDescribeArm Pressure (mmHG) +----------+--------+-------+--------+-------------------+ [id number]                                          +----------+--------+-------+--------+-------------------+ +---------+--------+--+--------+--+ VertebralPSV cm/s80EDV cm/s16 +---------+--------+--+--------+--+  Left Carotid Findings: +----------+--------+--------+--------+------------------+------------------+           PSV cm/sEDV cm/sStenosisPlaque DescriptionComments           +----------+--------+--------+--------+------------------+------------------+ CCA Prox  103     20                                intimal thickening +----------+--------+--------+--------+------------------+------------------+ CCA Distal83      18                                intimal thickening +----------+--------+--------+--------+------------------+------------------+ ICA Prox  55      20                                                   +----------+--------+--------+--------+------------------+------------------+ ICA Distal53      15                                                   +----------+--------+--------+--------+------------------+------------------+ ECA       76      13                                                   +----------+--------+--------+--------+------------------+------------------+ +----------+--------+--------+--------+-------------------+  PSV cm/sEDV cm/sDescribeArm Pressure (mmHG) +----------+--------+--------+--------+-------------------+ Subclavian120                                         +----------+--------+--------+--------+-------------------+ +---------+--------+---+--------+--+ VertebralPSV cm/s115EDV cm/s25 +---------+--------+---+--------+--+  Summary: Right Carotid: The extracranial vessels were near-normal with only minimal wall                thickening or plaque. Left Carotid: The extracranial vessels were near-normal with only minimal wall               thickening or plaque. Vertebrals:  Bilateral vertebral arteries  demonstrate antegrade flow. Subclavians: Normal flow hemodynamics were seen in bilateral subclavian              arteries. *See table(s) above for measurements and observations.     Preliminary         Scheduled Meds: .  stroke: mapping our early stages of recovery book   Does not apply Once  . amLODipine  2.5 mg Oral Daily  . aspirin  300 mg Rectal Daily   Or  . aspirin  325 mg Oral Daily  . atorvastatin  80 mg Oral q1800  . carvedilol  12.5 mg Oral BID WC  . enoxaparin (LOVENOX) injection  40 mg Subcutaneous Daily   Continuous Infusions: . sodium chloride       LOS: 0 days    Time spent: 35 minutes    Edwin Dada, MD [facility] 10/21/2018, 6:55 PM     Pager [phone] --- please page though AMION:  [email or web address] Password [email or web address] If 7PM-7AM, please contact night-coverage

## 2018-10-21 NOTE — Consult Note (Addendum)
Neurology Consultation  Reason for Consult: Left-sided numbness, left-sided facial droop Referring Physician: Dr. Wilson Singer  CC: Left sided numbness left-sided facial droop of 1 to 2 weeks duration History is obtained from: Patient, chart  HPI: John Joseph is a 73 y.o. male past medical history of hypertension, on metoprolol, presenting to the emergency room for evaluation of left-sided facial droop and left-sided numbness. Patient is somewhat of a poor historian.  He says that his symptoms of the left-sided numbness and facial droop happened sometime this past week but he could not give me a clear date but today he had an obvious left facial droop which brought him to the emergency room.  A noncontrast head CT was done that showed a age-indeterminate right basal ganglia/internal capsule hypodensity.  Transferred to [facility] for an MRI which confirmed the stroke to be late acute/early subacute in the right basal ganglia/internal capsule. Has been complaining of headache that he has had almost on a daily basis for the past 1 year.  He has been taking an aspirin 325 which she says is for the headache and helps him sleep as it relieves the headache. He also reports a similar episode of facial droop and numbness that happened sometime in the early part of the year.  Could not provide me more details on how long it lasted. He denies any visual changes.  He denies any focal weakness or clumsiness that he experienced with the symptoms.  The only concerning findings were facial droop and numbness on the left side. He denies any current exposure to anyone with COVID-19 infection.  Denies any fevers or chills.  Denies any nausea vomiting.  Denies any abdominal pain diarrhea constipation.  Denies chest pain shortness of breath.  Denies cough or cold symptoms.   LKW: At least 1 week ago tpa given?: no, outside the window Premorbid modified Rankin scale (mRS): 0  ROS: Patient is somewhat of a poor historian  but review of system was performed and is negative except as noted in HPI.  Past Medical History:  Diagnosis Date  . Hypertension    No family history on file. No family history of stroke  Social History:   reports that he has never smoked. He has never used smokeless tobacco. No history on file for alcohol and drug.  Never smoked, does not abuse alcohol or drugs.  Medications No current facility-administered medications for this encounter.   Current Outpatient Medications:  .  colchicine 0.6 MG tablet, Take 0.6 mg by mouth daily as needed (gout)., Disp: , Rfl:  .  metoprolol succinate (TOPROL-XL) 100 MG 24 hr tablet, Take 100 mg by mouth daily., Disp: , Rfl:   Exam: Current vital signs: BP (!) 193/99 (BP Location: Left Arm)   Pulse (!) 57   Temp 98 F (36.7 C) (Oral)   Resp 16   SpO2 97%  Vital signs in last 24 hours: Temp:  [98 F (36.7 C)] 98 F (36.7 C) (08/28 1636) Pulse Rate:  [56-73] 57 (08/29 0032) Resp:  [10-22] 16 (08/29 0032) BP: (155-197)/(89-120) 193/99 (08/29 0032) SpO2:  [96 %-100 %] 97 % (08/29 0032) Weight:  [86.2 kg] 86.2 kg (08/29 0140) General: Awake alert in no distress HEENT: Normocephalic atraumatic dry mucous membranes Lungs: Clear to auscultation Cardiovascular: Regular rate rhythm Abdomen: Soft nondistended nontender Extremities: Warm well perfused Neurological exam Mental status: Awake alert oriented x3.  Mildly reduced attention concentration. Speech and language: No dysarthria.  No aphasia. Cranial nerves: Pupils equal round  react light, extraocular movements intact, visual fields full, facial sensation intact bilaterally, very subtle drooping of the left angle of the mouth at rest, no droop noted on smiling, auditory acuity mildly reduced bilaterally, palate midline, shoulder shrug intact, tongue midline. Motor exam: No vertical drift any of the 4 extremities with symmetric 5/5 strength all over.  Normal tone.  Normal range of  motion. Sensory exam: Intact light touch with no extinction Cerebellar exam: Intact finger-nose-finger testing as well as bilateral heel-knee-shin testing. Gait testing was deferred at this time NIH stroke scale-1 for facial.  Labs I have reviewed labs in epic and the results pertinent to this consultation are: CBC    Component Value Date/Time   WBC 9.2 10/20/2018 1655   WBC 8.9 10/20/2018 1655   RBC 5.42 10/20/2018 1655   RBC 5.40 10/20/2018 1655   HGB 16.3 10/20/2018 1655   HGB 16.4 10/20/2018 1655   HCT 48.4 10/20/2018 1655   HCT 48.8 10/20/2018 1655   PLT 313 10/20/2018 1655   PLT 306 10/20/2018 1655   MCV 89.3 10/20/2018 1655   MCV 90.4 10/20/2018 1655   MCH 30.1 10/20/2018 1655   MCH 30.4 10/20/2018 1655   MCHC 33.7 10/20/2018 1655   MCHC 33.6 10/20/2018 1655   RDW 13.4 10/20/2018 1655   RDW 13.6 10/20/2018 1655   LYMPHSABS 2.1 10/20/2018 1655   LYMPHSABS 2.1 10/20/2018 1655   MONOABS 0.8 10/20/2018 1655   MONOABS 0.7 10/20/2018 1655   EOSABS 0.3 10/20/2018 1655   EOSABS 0.3 10/20/2018 1655   BASOSABS 0.1 10/20/2018 1655   BASOSABS 0.1 10/20/2018 1655    CMP     Component Value Date/Time   NA 137 10/20/2018 1655   NA 137 10/20/2018 1655   K 4.5 10/20/2018 1655   K 4.7 10/20/2018 1655   CL 103 10/20/2018 1655   CL 103 10/20/2018 1655   CO2 24 10/20/2018 1655   CO2 24 10/20/2018 1655   GLUCOSE 91 10/20/2018 1655   GLUCOSE 94 10/20/2018 1655   BUN 18 10/20/2018 1655   BUN 19 10/20/2018 1655   CREATININE 1.58 (H) 10/20/2018 1655   CREATININE 1.38 (H) 10/20/2018 1655   CALCIUM 9.3 10/20/2018 1655   CALCIUM 9.4 10/20/2018 1655   GFRNONAA 42 (L) 10/20/2018 1655   GFRNONAA 50 (L) 10/20/2018 1655   GFRAA 49 (L) 10/20/2018 1655   GFRAA 58 (L) 10/20/2018 1655   Imaging I have reviewed the images obtained:  CT-scan of the brain- right BG/int capsule hypodensity. Likely early subacute.  MRI examination of the brain - late acute/early subacute right BG /  internal capsule stroke.  Imaging not suggestive of amyloid angiopathy.  He does have chronic white matter disease   Assessment: 73 year old with a history of hypertension with to the best of our ability-1 week worth of fluctuating symptoms of left facial droop and left-sided numbness, noted to have a right basal ganglia/internal capsule infarct which is late acute to early subacute in timing based on imaging.  He is not a great historian and no family was present at the bedside.  Going by the EDP is note, teasing out the last known normal was not very successful even with the family.  Imaging confirms a late acute/early subacute infarct, likely small vessel etiology.  Given the age, full stroke work-up is recommended as below. Of note, no significant chronic microhemorrhages are noted.  The chronic white matter changes also are not very concerning for a vasculitic type etiology- I cannot  provide a clear explanation of why he has had chronic headaches.  Further information by family regarding any memory changes would be helpful.  Impression: Acute ischemic stroke-likely small vessel etiology  Recommendations: -Admit to hospitalist -Telemetry -CTA head and neck.  GFR is borderline.  If medicine team is concerned that that might be detrimental for his kidneys, then can do MRI of the head without contrast and carotid Dopplers. -Given that the symptoms have been present for about 7 days, I do not see a need for permissive hypertension and would recommend to start normalizing his blood pressures and have a goal blood pressure on discharge aimed at less than 140/90. -2D echocardiogram -Hemoglobin A1c -Lipid panel -Continue aspirin 325 for now.  Based on the vessel imaging, might need dual antiplatelets or can switch to Plavix only.  Will defer to further test result availability and stroke team rounding. -Atorvastatin 80 now and daily -PT OT speech therapy -N.p.o. until cleared by bedside swallow  evaluation -Gather more history from the family when they are around in the morning regarding these headaches that has been complaining of for the past 1 year as well as any association with a decreased cognitive decline which might point towards a vasculitic etiology although given the location of the stroke, small vessel disease secondary to uncontrolled hypertension is the most likely cause.  Stroke team will follow with you.  Relayed my plan to Dr. Dayna Barker who has resumed his care in the emergency room to call internal medicine for admission.  Please call with questions.  -- Amie Portland, MD [facility] Pager: [phone] If 7pm to 7am, please call on call as listed on AMION.

## 2018-10-21 NOTE — ED Notes (Signed)
Pt taken to MRI  

## 2018-10-21 NOTE — Progress Notes (Signed)
*  PRELIMINARY RESULTS* Echocardiogram 2D Echocardiogram has been performed.  John Joseph 10/21/2018, 4:30 PM

## 2018-10-21 NOTE — ED Notes (Signed)
ED TO INPATIENT HANDOFF REPORT  ED Nurse Name and Phone #: [phone]  S Name/Age/Gender John Joseph 73 y.o. male Room/Bed: H015C/H015C  Code Status   Code Status: Not on file  Home/SNF/Other Home Patient oriented to: self, place, time and situation Is this baseline? Yes   Triage Complete: Triage complete  Chief Complaint Stroke-Like Symptoms High BP  Triage Note Pt reports woke up this morning around 5am today with facial droop. Reports last night was last seen normal was around 930pm. Pt adds that simple things are causing him difficulty-reading labels cant make sense of what it says.  Pt reports had a bad headache but was relieved by Aspirin. 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     Allergies Allergies  Allergen Reactions  . Prednisone Other (See Comments)  . Keflex [Cephalexin] Other (See Comments)    Abdominal cramps    Level of Care/Admitting Diagnosis ED Disposition    ED Disposition Condition Comment   Admit  Hospital Area: [facility] [[id number]]  Level of Care: Telemetry Medical [104]  I expect the patient will be discharged within 24 hours: No (not a candidate for 5C-Observation unit)  Covid Evaluation: Asymptomatic Screening Protocol (No Symptoms)  Diagnosis: [facility]vascular accident) [id number]  Admitting Physician: Rise Patience [phone]  Attending Physician: Rise Patience [email or web address]  PT Class (Do Not Modify): Observation [104]  PT Acc Code (Do Not Modify): Observation [10022]       B Medical/Surgery History Past Medical History:  Diagnosis Date  . Hypertension    Past Surgical History:  Procedure Laterality Date  . KIDNEY SURGERY       A IV Location/Drains/Wounds Patient Lines/Drains/Airways Status   Active Line/Drains/Airways    Name:   Placement date:   Placement time:   Site:   Days:    Peripheral IV 10/20/18 Left Antecubital   10/20/18    1657    Antecubital   1          Intake/Output Last 24 hours No intake or output data in the 24 hours ending 10/21/18 0218  Labs/Imaging Results for orders placed or performed during the hospital encounter of 10/20/18 (from the past 48 hour(s))  CBC with Differential     Status: None   Collection Time: 10/20/18  4:55 PM  Result Value Ref Range   WBC 9.2 4.0 - 10.5 K/uL   RBC 5.42 4.22 - 5.81 MIL/uL   Hemoglobin 16.3 13.0 - 17.0 g/dL   HCT 48.4 39.0 - 52.0 %   MCV 89.3 80.0 - 100.0 fL   MCH 30.1 26.0 - 34.0 pg   MCHC 33.7 30.0 - 36.0 g/dL   RDW 13.4 11.5 - 15.5 %   Platelets 313 150 - 400 K/uL   nRBC 0.0 0.0 - 0.2 %   Neutrophils Relative % 64 %   Neutro Abs 5.9 1.7 - 7.7 K/uL   Lymphocytes Relative 23 %   Lymphs Abs 2.1 0.7 - 4.0 K/uL   Monocytes Relative 8 %   Monocytes Absolute 0.8 0.1 - 1.0 K/uL   Eosinophils Relative 3 %   Eosinophils Absolute 0.3 0.0 - 0.5 K/uL   Basophils Relative 1 %   Basophils Absolute 0.1 0.0 - 0.1 K/uL   Immature Granulocytes 1 %   Abs Immature Granulocytes 0.07 0.00 - 0.07 K/uL    Comment: Performed at  [facility]: [place] [address], [place] [address] [place], [place] [id number]  Basic metabolic panel     Status: Abnormal   Collection Time: 10/20/18  4:55 PM  Result Value Ref Range   Sodium 137 135 - 145 mmol/L   Potassium 4.5 3.5 - 5.1 mmol/L   Chloride 103 98 - 111 mmol/L   CO2 24 22 - 32 mmol/L   Glucose, Bld 91 70 - 99 mg/dL   BUN 18 8 - 23 mg/dL   Creatinine, Ser 1.58 (H) 0.61 - 1.24 mg/dL   Calcium 9.3 8.9 - 10.3 mg/dL   GFR calc non Af Amer 42 (L) >60 mL/min   GFR calc Af Amer 49 (L) >60 mL/min   Anion gap 10 5 - 15    Comment: Performed at [facility], [place] [address] [place], [place] [address]  Magnesium     Status: None   Collection Time: 10/20/18  4:55 PM  Result Value Ref Range   Magnesium 2.3 1.7 - 2.4 mg/dL    Comment: Performed at [facility], [place] [address], [place], [place] [id number]  Basic metabolic panel     Status: Abnormal   Collection Time: 10/20/18  4:55 PM  Result Value Ref Range   Sodium 137 135 - 145 mmol/L   Potassium 4.7 3.5 - 5.1 mmol/L   Chloride 103 98 - 111 mmol/L   CO2 24 22 - 32 mmol/L   Glucose, Bld 94 70 - 99 mg/dL   BUN 19 8 - 23 mg/dL   Creatinine, Ser 1.38 (H) 0.61 - 1.24 mg/dL   Calcium 9.4 8.9 - 10.3 mg/dL   GFR calc non Af Amer 50 (L) >60 mL/min   GFR calc Af Amer 58 (L) >60 mL/min   Anion gap 10 5 - 15    Comment: Performed at [place] [address], [place], [place] [address]  CBC with Differential     Status: None   Collection Time: 10/20/18  4:55 PM  Result Value Ref Range   WBC 8.9 4.0 - 10.5 K/uL   RBC 5.40 4.22 - 5.81 MIL/uL   Hemoglobin 16.4 13.0 - 17.0 g/dL   HCT 48.8 39.0 - 52.0 %   MCV 90.4 80.0 - 100.0 fL   MCH 30.4 26.0 - 34.0 pg   MCHC 33.6 30.0 - 36.0 g/dL   RDW 13.6 11.5 - 15.5 %   Platelets 306 150 - 400 K/uL   nRBC 0.0 0.0 - 0.2 %   Neutrophils Relative % 64 %   Neutro Abs 5.7 1.7 - 7.7 K/uL   Lymphocytes Relative 23 %   Lymphs Abs 2.1 0.7 - 4.0 K/uL   Monocytes Relative 8 %   Monocytes Absolute 0.7 0.1 - 1.0 K/uL   Eosinophils Relative 3 %   Eosinophils Absolute 0.3 0.0 - 0.5 K/uL   Basophils Relative 1 %   Basophils Absolute 0.1 0.0 - 0.1 K/uL   Immature Granulocytes 1 %   Abs Immature Granulocytes 0.05 0.00 - 0.07 K/uL    Comment: Performed at [facility], [place] [address], [place], [place] [address]  Magnesium     Status: None   Collection Time: 10/20/18  4:55 PM  Result Value Ref Range   Magnesium 2.3 1.7 - 2.4 mg/dL    Comment: Performed at [facility], [place] [address] [place],  [address]  Urinalysis, Routine w reflex microscopic     Status: Abnormal  Collection Time: 10/20/18  6:01 PM  Result Value Ref Range   Color, Urine YELLOW YELLOW   APPearance CLEAR CLEAR    Specific Gravity, Urine 1.020 1.005 - 1.030   pH 5.0 5.0 - 8.0   Glucose, UA NEGATIVE NEGATIVE mg/dL   Hgb urine dipstick NEGATIVE NEGATIVE   Bilirubin Urine NEGATIVE NEGATIVE   Ketones, ur NEGATIVE NEGATIVE mg/dL   Protein, ur 100 (A) NEGATIVE mg/dL   Nitrite NEGATIVE NEGATIVE   Leukocytes,Ua NEGATIVE NEGATIVE   RBC / HPF 0-5 0 - 5 RBC/hpf   WBC, UA 0-5 0 - 5 WBC/hpf   Bacteria, UA NONE SEEN NONE SEEN   Squamous Epithelial / LPF 0-5 0 - 5   Mucus PRESENT    Hyaline Casts, UA PRESENT     Comment: Performed at [facility], [place] [address], [place], [place] [address]  Ethanol     Status: None   Collection Time: 10/20/18  6:01 PM  Result Value Ref Range   Alcohol, Ethyl (B) <10 <10 mg/dL    Comment: (NOTE) Lowest detectable limit for serum alcohol is 10 mg/dL. For medical purposes only. Performed at [facility], [place] [address], [place], [place] [address]    Ct Head Wo Contrast  Result Date: 10/20/2018 CLINICAL DATA:  73 year old male with headache facial numbness for 4 days. Confusion for 1 day. EXAM: CT HEAD WITHOUT CONTRAST TECHNIQUE: Contiguous axial images were obtained from the base of the skull through the vertex without intravenous contrast. COMPARISON:  None. FINDINGS: Brain: An age indeterminate infarct within the RIGHT basal ganglia/genu of the internal capsule may be subacute. There is no evidence of hemorrhage. 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 There is no evidence of midline shift, mass, extra-axial collection or hydrocephalus. Vascular: Atherosclerotic calcifications noted. Skull: Normal. Negative for fracture or focal lesion. Sinuses/Orbits: No acute finding. Other: None. IMPRESSION: 1. Age indeterminate RIGHT basal ganglia/internal capsule which may be subacute. No evidence of hemorrhage. 2. Atrophy, chronic small-vessel white matter ischemic  changes and small remote LEFT parietal white matter infarct. Electronically Signed   By: Margarette Canada M.D.   On: 10/20/2018 18:44   Mr Brain Wo Contrast  Result Date: 10/21/2018 CLINICAL DATA:  Confusion and difficulty speaking. EXAM: MRI HEAD WITHOUT CONTRAST TECHNIQUE: Multiplanar, multiecho pulse sequences of the brain and surrounding structures were obtained without intravenous contrast. COMPARISON:  Head CT 10/20/2018 FINDINGS: BRAIN: There is a small right basal ganglia infarct. No hemorrhage or mass effect. Multifocal white matter hyperintensity, most commonly due to chronic ischemic microangiopathy. There is generalized atrophy without lobar predilection. The midline structures are normal. VASCULAR: The major intracranial arterial and venous sinus flow voids are normal. Susceptibility-sensitive sequences show no chronic microhemorrhage or superficial siderosis. SKULL AND UPPER CERVICAL SPINE: Calvarial bone marrow signal is normal. There is no skull base mass. The visualized upper cervical spine and soft tissues are normal. SINUSES/ORBITS: There are no fluid levels or advanced mucosal thickening. The mastoid air cells and middle ear cavities are free of fluid. The orbits are normal. IMPRESSION: Small right basal ganglia acute/early subacute infarct without hemorrhage or mass effect. Electronically Signed   By: Ulyses Jarred M.D.   On: 10/21/2018 02:15    Pending Labs Unresulted Labs (From admission, onward)    Start     Ordered   10/21/18 0210  SARS CORONAVIRUS 2 (TAT 6-12 HRS) Nasal Swab Aptima Multi Swab  (Asymptomatic/Tier  2 Patients Labs)  Once,   STAT    Question Answer Comment  Is this test for diagnosis or screening Screening   Symptomatic for COVID-19 as defined by CDC No   Hospitalized for COVID-19 No   Admitted to ICU for COVID-19 No   Previously tested for COVID-19 No   Resident in a congregate (group) care setting No   Employed in healthcare setting No      10/21/18 0209           Vitals/Pain Today's Vitals   10/20/18 1930 10/20/18 1945 10/20/18 2305 10/21/18 0032  BP: (!) 186/114 (!) 185/119 (!) 169/106 (!) 193/99  Pulse: 72 73 65 (!) 57  Resp: 19 19 18 16   Temp:      TempSrc:      SpO2: 97% 98% 97% 97%  PainSc:        Isolation Precautions No active isolations  Medications Medications  aspirin chewable tablet 324 mg (324 mg Oral Given 10/20/18 2020)    Mobility walks Low fall risk   Focused Assessments Neuro Assessment Handoff:  Swallow screen pass? Yes    NIH Stroke Scale ( + Modified Stroke Scale Criteria)  Interval: Initial Level of Consciousness (1a.)   : Alert, keenly responsive LOC Questions (1b. )   +: Answers both questions correctly LOC Commands (1c. )   + : Performs both tasks correctly Best Gaze (2. )  +: Normal Visual (3. )  +: No visual loss Facial Palsy (4. )    : Normal symmetrical movements Motor Arm, Left (5a. )   +: No drift Motor Arm, Right (5b. )   +: No drift Motor Leg, Left (6a. )   +: No drift Motor Leg, Right (6b. )   +: No drift Limb Ataxia (7. ): Absent Sensory (8. )   +: Normal, no sensory loss Best Language (9. )   +: No aphasia Dysarthria (10. ): Normal Extinction/Inattention (11.)   +: No Abnormality Modified SS Total  +: 0 Complete NIHSS TOTAL: 0 Last date known well: 10/20/18 Last time known well: 0730 Neuro Assessment: Exceptions to WDL Neuro Checks:   Initial (10/21/18 0036)  Last Documented NIHSS Modified Score: 0 (10/21/18 0036) Has TPA been given? No If patient is a Neuro Trauma and patient is going to OR before floor call report to [facility] nurse: [phone] or [phone]     R Recommendations: See Admitting Provider Note  Report given to:   Additional Notes:

## 2018-10-21 NOTE — Progress Notes (Signed)
Patient arrived to room 3w15 from ED.  Assessment complete, VS obtained, and Admission database began.

## 2018-10-21 NOTE — Progress Notes (Addendum)
STROKE TEAM PROGRESS NOTE   HISTORY OF PRESENT ILLNESS (per record) John Joseph is a 73 y.o. male past medical history of hypertension, on metoprolol, presenting to the emergency room for evaluation of left-sided facial droop and left-sided numbness. Patient is somewhat of a poor historian.  He says that his symptoms of the left-sided numbness and facial droop happened sometime this past week but he could not give me a clear date but today he had an obvious left facial droop which brought him to the emergency room.  A noncontrast head CT was done that showed a age-indeterminate right basal ganglia/internal capsule hypodensity.  Transferred to [facility] for an MRI which confirmed the stroke to be late acute/early subacute in the right basal ganglia/internal capsule. Has been complaining of headache that he has had almost on a daily basis for the past 1 year.  He has been taking an aspirin 325 which she says is for the headache and helps him sleep as it relieves the headache. He also reports a similar episode of facial droop and numbness that happened sometime in the early part of the year.  Could not provide me more details on how long it lasted. He denies any visual changes.  He denies any focal weakness or clumsiness that he experienced with the symptoms.  The only concerning findings were facial droop and numbness on the left side. He denies any current exposure to anyone with COVID-19 infection.  Denies any fevers or chills.  Denies any nausea vomiting.  Denies any abdominal pain diarrhea constipation.  Denies chest pain shortness of breath.  Denies cough or cold symptoms.  LKW: At least 1 week ago tpa given?: no, outside the window Premorbid modified Rankin scale (mRS): 0  INTERVAL HISTORY He snores and has a headache every morning on waking. Tired all day, never feels refreshed even in the morning. Needs sleep eval. On asa 325 at home daily. Endorsed compliance. I discussed untreated sleep  apnea with patient and encouraged sleep evaluation and sleep study.    OBJECTIVE Vitals:   10/21/18 0340 10/21/18 0540 10/21/18 0600 10/21/18 0608  BP: (!) 163/105 (!) 157/95    Pulse: 68 70    Resp: 18 16    Temp: 97.6 F (36.4 C)     TempSrc: Oral     SpO2: 95% 96%    Weight:   86.2 kg   Height:   5\' 6"  (1.676 m) 5\' 6"  (1.676 m)    CBC:  Recent Labs  Lab 10/20/18 1655 10/21/18 0451  WBC 8.9  9.2 7.7  NEUTROABS 5.7  5.9  --   HGB 16.4  16.3 14.9  HCT 48.8  48.4 43.7  MCV 90.4  89.3 88.5  PLT 306  313 [id number]    Basic Metabolic Panel:  Recent Labs  Lab 10/20/18 1655 10/21/18 0451  NA 137  137 137  K 4.7  4.5 3.6  CL 103  103 102  CO2 24  24 22   GLUCOSE 94  91 140*  BUN 19  18 16   CREATININE 1.38*  1.58* 1.39*  CALCIUM 9.4  9.3 8.7*  MG 2.3  2.3  --     Lipid Panel:     Component Value Date/Time   CHOL 172 10/21/2018 0451   TRIG 101 10/21/2018 0451   HDL 36 (L) 10/21/2018 0451   CHOLHDL 4.8 10/21/2018 0451   VLDL 20 10/21/2018 0451   LDLCALC 116 (H) 10/21/2018 0451   HgbA1c:  Lab Results  Component Value Date   HGBA1C 5.1 10/21/2018   Urine Drug Screen: No results found for: LABOPIA, COCAINSCRNUR, LABBENZ, AMPHETMU, THCU, LABBARB  Alcohol Level     Component Value Date/Time   ETH <10 10/20/2018 1801    IMAGING  Ct Head Wo Contrast 10/20/2018 IMPRESSION:  1. Age indeterminate RIGHT basal ganglia/internal capsule which may be subacute. No evidence of hemorrhage.  2. 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   Mr Angio Head Wo Contrast 10/21/2018 IMPRESSION:  Normal examination of the intracranial large and medium sized vessels. Mild atherosclerotic irregularity of the distal branch vessels.   Mr Brain Wo Contrast 10/21/2018 IMPRESSION:  Small right basal ganglia acute/early subacute infarct without hemorrhage or mass effect.     Transthoracic  Echocardiogram  00/00/2020 Pending No results found for this or any previous visit (from the past 43800 hour(s)).   Bilateral Carotid Dopplers  00/00/2020 Pending  ECG - SR rate 71 BPM. (See cardiology reading for complete details)  PHYSICAL EXAM Blood pressure (!) 157/95, pulse 70, temperature 97.6 F (36.4 C), temperature source Oral, resp. rate 16, height 5\' 6"  (1.676 m), weight 86.2 kg, SpO2 96 %.  Exam: NAD, pleasant                  Speech:    Speech is normal; fluent and spontaneous with normal comprehension. Appears to have some impaired memory, decreased attention and concentration, poor historian. Cognition:    The patient is oriented to person, place, and time;     recent memory impaired;     language fluent;    Cranial Nerves:    The pupils are equal, round, and reactive to light.Trigeminal sensation is intact and the muscles of mastication are normal. The face has a very mild left droop. Facial sensation intact. The palate elevates in the midline. Hearing intact to voice. Voice is normal. Shoulder shrug is normal. The tongue has normal motion without fasciculations.   Coordination:  No dysmetria  Motor Observation:    No asymmetry, no atrophy, and no involuntary movements noted. Tone:    Normal muscle tone.     Strength:    Strength is V/V in the upper and lower limbs.      Sensation: intact to LT   ASSESSMENT/PLAN Mr. John Joseph is a 73 y.o. male with history of hypertension, gout and hx of headaches presenting with facial droop and left sided numbness. 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 Atrophy, chronic small-vessel white matter ischemic changes and small remote LEFT  parietal white matter infarct.   MRI head - Small right basal ganglia acute/early subacute infarct   MRA head - normal  CTA H&N - not ordered  CT Perfusion - not ordered  Carotid Doppler - pending  2D Echo - pending  Sars Corona Virus 2 - negative  LDL - 116  HgbA1c - 5.1  UDS - not ordered  VTE prophylaxis - Lovenox Diet  Diet Order            Diet Heart Room service appropriate? Yes; Fluid consistency: Thin  Diet effective now  No antithrombotic prior to admission, now on aspirin 325 mg daily  Patient counseled to be compliant with his antithrombotic medications. Needs Dual Antiplatelet therapy for 3 weeks then Plavix 75mg  alone.   Ongoing aggressive stroke risk factor management  Therapy recommendations:  pending  Disposition:  Pending  Hypertension  Home BP meds: metoprolol XL 100 mg daily  Current BP meds: metoprolol XL 100 mg daily  Stable . Permissive hypertension (OK if < 220/120) but gradually normalize in 5-7 days  . Long-term BP goal normotensive  Hyperlipidemia  Home Lipid lowering medication:  none  LDL 116, goal < 70  Current lipid lowering medication: Lipitor 40 mg daily   Continue statin at discharge   Other Stroke Risk Factors  Advanced age  Obesity, Body mass index is 30.67 kg/m., recommend weight loss, diet and exercise as appropriate   Hx stroke (by CT)  Family hx stroke - unknown   Other Active Problems  Creatinine - 1.39  PLAN  Await echo and CUS results - if unremarkable stroke will sign off  Await therapy evaluations  Needs outpt.sleep study for possible OSA causing morning headaches (he snores, excessive daytime fatigue, wakes with headaches daily). Please refer to Dr. Asencion Partridge Dohmeier at [facility]for sleep study and evaluation first available appointment.  Refer to Janett Billow, stroke RN at [facility] for stroke follow up in 4-6 weeks  Needs Dual Antiplatelet therapy for 3  weeks then Plavix 75mg  alone. If echo and carotid dopplers unremarkable, stroke will sign off. thanks   Hospital day # 0  Personally examined patient and images, and have participated in and made any corrections needed to history, physical, neuro exam,assessment and plan as stated above.  I have personally obtained the history, evaluated lab date, reviewed imaging studies and agree with radiology interpretations.    Sarina Ill, MD Stroke Neurology   A total of 25 minutes was spent for the care of this patient, spent on counseling patient or family on different diagnostic and therapeutic options, counseling and coordination of care, risks and benefits of management, compliance, or risk factor reduction and education.   To contact Stroke Continuity provider, please refer to [email or web address] After hours, contact General Neurology

## 2018-10-21 NOTE — H&P (Addendum)
History and Physical    John Joseph [id number] DOB: November 19, 1945 DOA: 10/20/2018  PCP: Jonathon Jordan, MD  Patient coming from: Home.  Chief Complaint: Left facial droop.  HPI: John Joseph is a 73 y.o. male with history of hypertension was brought to the ER after patient's wife noticed that patient was having left facial droop.  Patient states over the last 2 weeks he has been having frontal headache and increased confusion.  Yesterday morning the patient's wife noticed that patient had left facial droop and was brought to the ER.  Patient is a poor historian.  Denies any chest pain shortness of breath nausea vomiting diarrhea.  Denies any fever chills.  Denies any weakness of the upper or lower extremities.  Denies any visual symptoms.  ED Course: In the ER patient had mild left facial droop.  CT of the head was abnormal and on-call neurology was consulted and had MRI brain done which shows acute/subacute infarct involving the right basal ganglia area.  Patient passed swallow.  Blood pressure is markedly elevated with systolic in the [id number] when patient arrived.  Labs revealed creatinine of 1.5 no old labs to compare per EKG shows normal sinus rhythm with ST elevation in the lead I.  Discussed with cardiologist and since patient did not have any chest pain or any changes in other leads no further action required for this.  Review of Systems: As per HPI, rest all negative.   Past Medical History:  Diagnosis Date  . Hypertension     Past Surgical History:  Procedure Laterality Date  . KIDNEY SURGERY       reports that he has never smoked. He has never used smokeless tobacco. No history on file for alcohol and drug.  Allergies  Allergen Reactions  . Prednisone Other (See Comments)  . Keflex [Cephalexin] Other (See Comments)    Abdominal cramps    Family History  Family history unknown: Yes    Prior to Admission medications   Medication Sig Start Date End Date Taking?  Authorizing Provider  colchicine 0.6 MG tablet Take 0.6 mg by mouth daily as needed (gout).   Yes [provider]  metoprolol succinate (TOPROL-XL) 100 MG 24 hr tablet Take 100 mg by mouth daily. 07/21/18  Yes [provider]    Physical Exam: Constitutional: Moderately built and nourished. Vitals:   10/20/18 1945 10/20/18 2305 10/21/18 0032 10/21/18 0241  BP: (!) 185/119 (!) 169/106 (!) 193/99 (!) 186/107  Pulse: 73 65 (!) 57   Resp: 19 18 16    Temp:      TempSrc:      SpO2: 98% 97% 97%    Eyes: Anicteric no pallor. ENMT: No discharge from the ears eyes nose or mouth. Neck: No mass.  No neck rigidity.  No JVD appreciated. Respiratory: No rhonchi or crepitations. Cardiovascular: S1-S2 heard. Abdomen: Soft nontender bowel sounds present. Musculoskeletal: No edema. Skin: No rash. Neurologic: Alert awake oriented to time place and person.  Moves all extremities 5 x 5.  Mild left facial droop.  Pupils are equal and reactive light.  Tongue was midline. Psychiatric: Appears normal.   Labs on Admission: I have personally reviewed following labs and imaging studies  CBC: Recent Labs  Lab 10/20/18 1655  WBC 8.9  9.2  NEUTROABS 5.7  5.9  HGB 16.4  16.3  HCT 48.8  48.4  MCV 90.4  89.3  PLT 306  [id number]   Basic Metabolic Panel: Recent Labs  Lab 10/20/18 1655  NA 137  137  K 4.7  4.5  CL 103  103  CO2 24  24  GLUCOSE 94  91  BUN 19  18  CREATININE 1.38*  1.58*  CALCIUM 9.4  9.3  MG 2.3  2.3   GFR: CrCl cannot be calculated (Unknown ideal weight.). Liver Function Tests: No results for input(s): AST, ALT, ALKPHOS, BILITOT, PROT, ALBUMIN in the last 168 hours. No results for input(s): LIPASE, AMYLASE in the last 168 hours. No results for input(s): AMMONIA in the last 168 hours. Coagulation Profile: No results for input(s): INR, PROTIME in the last 168 hours. Cardiac Enzymes: No results for input(s): CKTOTAL, CKMB, CKMBINDEX, TROPONINI in the  last 168 hours. BNP (last 3 results) No results for input(s): PROBNP in the last 8760 hours. HbA1C: No results for input(s): HGBA1C in the last 72 hours. CBG: No results for input(s): GLUCAP in the last 168 hours. Lipid Profile: No results for input(s): CHOL, HDL, LDLCALC, TRIG, CHOLHDL, LDLDIRECT in the last 72 hours. Thyroid Function Tests: No results for input(s): TSH, T4TOTAL, FREET4, T3FREE, THYROIDAB in the last 72 hours. Anemia Panel: No results for input(s): VITAMINB12, FOLATE, FERRITIN, TIBC, IRON, RETICCTPCT in the last 72 hours. Urine analysis:    Component Value Date/Time   COLORURINE YELLOW 10/20/2018 1801   APPEARANCEUR CLEAR 10/20/2018 1801   LABSPEC 1.020 10/20/2018 1801   PHURINE 5.0 10/20/2018 1801   GLUCOSEU NEGATIVE 10/20/2018 1801   HGBUR NEGATIVE 10/20/2018 1801   BILIRUBINUR NEGATIVE 10/20/2018 1801   KETONESUR NEGATIVE 10/20/2018 1801   PROTEINUR 100 (A) 10/20/2018 1801   NITRITE NEGATIVE 10/20/2018 1801   LEUKOCYTESUR NEGATIVE 10/20/2018 1801   Sepsis Labs: @LABRCNTIP (procalcitonin:4,lacticidven:4) )No results found for this or any previous visit (from the past 240 hour(s)).   Radiological Exams on Admission: Ct Head Wo Contrast  Result Date: 10/20/2018 CLINICAL DATA:  73 year old male with headache facial numbness for 4 days. Confusion for 1 day. EXAM: CT HEAD WITHOUT CONTRAST TECHNIQUE: Contiguous axial images were obtained from the base of the skull through the vertex without intravenous contrast. COMPARISON:  None. FINDINGS: Brain: An age indeterminate infarct within the RIGHT basal ganglia/genu of the internal capsule may be subacute. There is no evidence of hemorrhage. 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 There is no evidence of midline shift, mass, extra-axial collection or hydrocephalus. Vascular: Atherosclerotic calcifications noted. Skull: Normal. Negative for  fracture or focal lesion. Sinuses/Orbits: No acute finding. Other: None. IMPRESSION: 1. Age indeterminate RIGHT basal ganglia/internal capsule which may be subacute. No evidence of hemorrhage. 2. 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 Electronically Signed   By: Margarette Canada M.D.   On: 10/20/2018 18:44   Mr Brain Wo Contrast  Result Date: 10/21/2018 CLINICAL DATA:  Confusion and difficulty speaking. EXAM: MRI HEAD WITHOUT CONTRAST TECHNIQUE: Multiplanar, multiecho pulse sequences of the brain and surrounding structures were obtained without intravenous contrast. COMPARISON:  Head CT 10/20/2018 FINDINGS: BRAIN: There is a small right basal ganglia infarct. No hemorrhage or mass effect. Multifocal white matter hyperintensity, most commonly due to chronic ischemic microangiopathy. There is generalized atrophy without lobar predilection. The midline structures are normal. VASCULAR: The major intracranial arterial and venous sinus flow voids are normal. Susceptibility-sensitive sequences show no chronic microhemorrhage or superficial siderosis. SKULL AND UPPER CERVICAL SPINE: Calvarial bone marrow signal is normal. There is no skull base mass. The visualized upper cervical  spine and soft tissues are normal. SINUSES/ORBITS: There are no fluid levels or advanced mucosal thickening. The mastoid air cells and middle ear cavities are free of fluid. The orbits are normal. IMPRESSION: Small right basal ganglia acute/early subacute infarct without hemorrhage or mass effect. Electronically Signed   By: Ulyses Jarred M.D.   On: 10/21/2018 02:15    EKG: Independently reviewed.  Normal sinus rhythm with ST-T change in the lead I.  Assessment/Plan Principal Problem:   Acute CVA (cerebrovascular accident) [facility]) Active Problems:   Hypertensive urgency    1. Acute CVA -appreciate neurology consult.  Will get 2D echo carotid Doppler MRI brain.   Patient passed swallow.  We will keep patient on aspirin Lipitor check lipid panel hemoglobin A1c physical therapy consult. 2. Hypertensive urgency -will allow for permissive hypertension.  Patient on metoprolol.  PRN IV hydralazine for systolic blood pressure more than [id number] and diastolic more than [id number]. 3. Possible chronic kidney disease stage III with no old labs to compare.  Follow metabolic panel. 4. Headache likely from uncontrolled hypertension.  Patient states he has had previous episodes of migraine when he was younger.  Check sed rate. 5. Abnormal EKG -patient's lead I was showing ST elevation.  Discussed with cardiologist.  Since patient does not have any chest pain and the one lead was showing the changes cardiology advised no further work-up.  Will check troponin.   DVT prophylaxis: Lovenox. Code Status: Full code. Family Communication: We will need to discuss with patient's wife when available. Disposition Plan: Home. Consults called: Neurology. Admission status: Observation.   Rise Patience MD [facility] Pager [phone].  If 7PM-7AM, please contact night-coverage [email or web address] Password [facility]  10/21/2018, 3:36 AM

## 2018-10-21 NOTE — Evaluation (Signed)
Occupational Therapy Evaluation Patient Details Name: John Joseph MRN: [id number] DOB: 07-29-1945 Today's Date: 10/21/2018    History of Present Illness  Candelario Belone is a 73 y.o. male with history of hypertension was brought to the ER after patient's wife noticed that patient was having left facial droop.  Patient states over the last 2 weeks he has been having frontal headache and increased confusion. 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    Clinical Impression   Pt PTA: pt living with spouse, retired. Pt currently  Pt with short term memory deficits, slow processing and decreased sustained attention. Pt given Short Blessed test: 7/28. questionable impairment (requires more testing); pt repeated phrases and unable to process without cues stating the months of the year in reverse. Pt supervisionA for ADL and mobility. Pt given multi step commands and requires cues to break down tasks as in finger to nose test- took increased time for processing. Pt requires assistance to attend to tasks. Pt would benefit from continued OT skilled services for ADL, mobility and safety. OT following acutely.     Follow Up Recommendations  Outpatient OT;Supervision/Assistance - 24 hour(initially 24/7 at home)    Equipment Recommendations  None recommended by OT    Recommendations for Other Services       Precautions / Restrictions Precautions Precautions: Fall Restrictions Weight Bearing Restrictions: No      Mobility Bed Mobility Overal bed mobility: Independent                Transfers Overall transfer level: Modified independent Equipment used: None                  Balance Overall balance assessment: Mild deficits observed, not formally tested                                         ADL either performed or assessed with clinical judgement   ADL Overall ADL's : Needs assistance/impaired Eating/Feeding: Set up;Sitting   Grooming:  Set up;Sitting   Upper Body Bathing: Set up;Sitting   Lower Body Bathing: Set up;Sitting/lateral leans   Upper Body Dressing : Supervision/safety;Sitting   Lower Body Dressing: Supervision/safety;Sitting/lateral leans   Toilet Transfer: Supervision/safety;Cueing for safety   Toileting- Clothing Manipulation and Hygiene: Supervision/safety;Sitting/lateral lean       Functional mobility during ADLs: Supervision/safety       Vision Baseline Vision/History: No visual deficits Vision Assessment?: Vision impaired- to be further tested in functional context;Yes Eye Alignment: Within Functional Limits Ocular Range of Motion: Within Functional Limits Alignment/Gaze Preference: Within Defined Limits Additional Comments: slight inattention, WFLs for central, peripheral and convergence.     Perception     Praxis      Pertinent Vitals/Pain Pain Assessment: No/denies pain     Hand Dominance Right   Extremity/Trunk Assessment Upper Extremity Assessment Upper Extremity Assessment: Overall WFL for tasks assessed   Lower Extremity Assessment Lower Extremity Assessment: Defer to PT evaluation   Cervical / Trunk Assessment Cervical / Trunk Assessment: Normal   Communication Communication Communication: Expressive difficulties   Cognition Arousal/Alertness: Awake/alert Behavior During Therapy: WFL for tasks assessed/performed Overall Cognitive Status: Impaired/Different from baseline Area of Impairment: Memory;Problem solving;Safety/judgement;Following commands;Attention                   Current Attention Level: Sustained Memory: Decreased short-term memory Following Commands: Follows one step commands consistently;Follows multi-step  commands with increased time Safety/Judgement: Decreased awareness of safety;Decreased awareness of deficits   Problem Solving: Slow processing;Difficulty sequencing;Requires verbal cues General Comments: Short Blessed test: 7/28.  questionable impairment (requires more testing); pt repeated phrases, was unaware when this therapist introduced myself after patient came out of bathroom and kept talking about a subject unrelated to OT. Pt assumed this OT was the same PT taht had just been working with him even after introduction. Pt with short term memory deficits, slow processing and decreased sustained attention.   General Comments  pt reports frequent L headache past month and increasing "Brain fog" that seems somewhat better today    Exercises     Shoulder Instructions      Home Living Family/patient expects to be discharged to:: Private residence Living Arrangements: Spouse/significant other Available Help at Discharge: Family;Available 24 hours/day Type of Home: House Home Access: Stairs to enter [employer] of Steps: 3 Entrance Stairs-Rails: None Home Layout: Two level Alternate Level Stairs-Number of Steps: flight Alternate Level Stairs-Rails: Right Bathroom Shower/Tub: Tub/shower unit;Walk-in shower   Bathroom Toilet: Standard     Home Equipment: Wheelchair - manual          Prior Functioning/Environment Level of Independence: Independent        Comments: 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 Problem List: Decreased cognition;Decreased Surveyor, mining      OT Treatment/Interventions: Field seismologist;Therapeutic exercise;Energy conservation;Cognitive remediation/compensation;Patient/family education    OT Goals(Current goals can be found in the care plan section) Acute Rehab OT Goals Patient Stated Goal: go home OT Goal Formulation: With patient Time For Goal Achievement: 11/04/18 Potential to Achieve Goals: Good ADL Goals Additional ADL Goal #1: Pt will perform higher level cognitive tasks with x10 mins of sustained attention with less than 2 verbal cues Additional ADL Goal #2: Pt will improve multi step commands with 100% accuracy in 2/2  trials.  OT Frequency: Min 2X/week   Barriers to D/C:            Co-evaluation              AM-PAC OT "6 Clicks" Daily Activity     Outcome Measure Help from another person eating meals?: None Help from another person taking care of personal grooming?: None Help from another person toileting, which includes using toliet, bedpan, or urinal?: A Little Help from another person bathing (including washing, rinsing, drying)?: A Little Help from another person to put on and taking off regular upper body clothing?: None Help from another person to put on and taking off regular lower body clothing?: None 6 Click Score: 22   End of Session Equipment Utilized During Treatment: Gait belt Nurse Communication: Mobility status  Activity Tolerance:   Patient left: in bed;with call bell/phone within reach;with bed alarm set  OT Visit Diagnosis: Unsteadiness on feet (R26.81);Other symptoms and signs involving cognitive function                Time: 1125-1200 OT Time Calculation (min): 35 min Charges:  OT General Charges $OT Visit: 1 Visit OT Evaluation $OT Eval Moderate Complexity: 1 Mod OT Treatments $Neuromuscular Re-education: 8-22 mins  Ebony Hail Harold Hedge) Marsa Aris OTR/L Acute Rehabilitation Services Pager: [phone] Office: [place] 10/21/2018, 4:57 PM

## 2018-10-21 NOTE — Progress Notes (Signed)
VASCULAR LAB PRELIMINARY  PRELIMINARY  PRELIMINARY  PRELIMINARY  Carotid duplex completed.    Preliminary report:  See CV proc for preliminary results.   Lamount Bankson, RVT 10/21/2018, 4:15 PM

## 2018-10-22 DIAGNOSIS — I161 Hypertensive emergency: Secondary | ICD-10-CM | POA: Diagnosis not present

## 2018-10-22 DIAGNOSIS — I639 Cerebral infarction, unspecified: Secondary | ICD-10-CM | POA: Diagnosis not present

## 2018-10-22 LAB — CBC
HCT: 46.5 % (ref 39.0–52.0)
Hemoglobin: 15.5 g/dL (ref 13.0–17.0)
MCH: 29.6 pg (ref 26.0–34.0)
MCHC: 33.3 g/dL (ref 30.0–36.0)
MCV: 88.7 fL (ref 80.0–100.0)
Platelets: 262 10*3/uL (ref 150–400)
RBC: 5.24 MIL/uL (ref 4.22–5.81)
RDW: 13.3 % (ref 11.5–15.5)
WBC: 9.8 10*3/uL (ref 4.0–10.5)
nRBC: 0 % (ref 0.0–0.2)

## 2018-10-22 LAB — BASIC METABOLIC PANEL
Anion gap: 9 (ref 5–15)
BUN: 14 mg/dL (ref 8–23)
CO2: 27 mmol/L (ref 22–32)
Calcium: 9.2 mg/dL (ref 8.9–10.3)
Chloride: 102 mmol/L (ref 98–111)
Creatinine, Ser: 1.26 mg/dL — ABNORMAL HIGH (ref 0.61–1.24)
GFR calc Af Amer: >60 mL/min (ref >60)
GFR calc non Af Amer: 56 mL/min — ABNORMAL LOW (ref >60)
Glucose, Bld: 91 mg/dL (ref 70–99)
Potassium: 4 mmol/L (ref 3.5–5.1)
Sodium: 138 mmol/L (ref 135–145)

## 2018-10-22 MED ORDER — CLOPIDOGREL BISULFATE 75 MG PO TABS: 75.0000 mg | ORAL_TABLET | Freq: Every day | ORAL | 11 refills | Status: AC

## 2018-10-22 MED ORDER — CARVEDILOL 12.5 MG PO TABS: 12.5000 mg | ORAL_TABLET | Freq: Two times a day (BID) | ORAL | 3 refills | Status: DC

## 2018-10-22 MED ORDER — CLOPIDOGREL BISULFATE 75 MG PO TABS: 75.0000 mg | ORAL_TABLET | Freq: Every day | ORAL | 11 refills | Status: DC

## 2018-10-22 MED ORDER — ATORVASTATIN CALCIUM 40 MG PO TABS: 40.0000 mg | ORAL_TABLET | Freq: Every day | ORAL | 3 refills | Status: DC

## 2018-10-22 MED ORDER — SUMATRIPTAN SUCCINATE 25 MG PO TABS
25.0000 mg | ORAL_TABLET | Freq: Once | ORAL | Status: AC
  Administered 2018-10-22: 03:00:00 25 mg via ORAL
  Filled 2018-10-22: qty 1

## 2018-10-22 MED ORDER — ASPIRIN EC 81 MG PO TBEC: 81.0000 mg | DELAYED_RELEASE_TABLET | Freq: Every day | ORAL | 2 refills | Status: AC

## 2018-10-22 MED ORDER — CARVEDILOL 12.5 MG PO TABS: 12.5000 mg | ORAL_TABLET | Freq: Two times a day (BID) | ORAL | 3 refills | Status: AC

## 2018-10-22 MED ORDER — STROKE: EARLY STAGES OF RECOVERY BOOK
Freq: Once | Status: AC
  Administered 2018-10-22: 13:00:00

## 2018-10-22 MED ORDER — AMLODIPINE BESYLATE 2.5 MG PO TABS: 2.5000 mg | ORAL_TABLET | Freq: Every day | ORAL | 3 refills | Status: DC

## 2018-10-22 NOTE — Progress Notes (Signed)
STROKE TEAM PROGRESS NOTE   HISTORY OF PRESENT ILLNESS (per record) John Joseph is a 73 y.o. male past medical history of hypertension, on metoprolol, presenting to the emergency room for evaluation of left-sided facial droop and left-sided numbness. Patient is somewhat of a poor historian.  He says that his symptoms of the left-sided numbness and facial droop happened sometime this past week but he could not give me a clear date but today he had an obvious left facial droop which brought him to the emergency room.  A noncontrast head CT was done that showed a age-indeterminate right basal ganglia/internal capsule hypodensity.  Transferred to [facility] for an MRI which confirmed the stroke to be late acute/early subacute in the right basal ganglia/internal capsule. Has been complaining of headache that he has had almost on a daily basis for the past 1 year.  He has been taking an aspirin 325 which she says is for the headache and helps him sleep as it relieves the headache. He also reports a similar episode of facial droop and numbness that happened sometime in the early part of the year.  Could not provide me more details on how long it lasted. He denies any visual changes.  He denies any focal weakness or clumsiness that he experienced with the symptoms.  The only concerning findings were facial droop and numbness on the left side. He denies any current exposure to anyone with COVID-19 infection.  Denies any fevers or chills.  Denies any nausea vomiting.  Denies any abdominal pain diarrhea constipation.  Denies chest pain shortness of breath.  Denies cough or cold symptoms.  LKW: At least 1 week ago tpa given?: no, outside the window Premorbid modified Rankin scale (mRS): 0  INTERVAL HISTORY He snores and has a headache every morning on waking. Tired all day, never feels refreshed even in the morning. Needs sleep eval. On asa 325 at home daily. Endorsed compliance. I discussed untreated sleep  apnea with patient and encouraged sleep evaluation and sleep study.  Patient today alert, oriented x 4, asking appropriate questions, knowledgeable about his medical conditions.     OBJECTIVE Vitals:   10/21/18 2013 10/22/18 0000 10/22/18 0326 10/22/18 0827  BP: (!) 154/97 (!) 156/112 (!) 174/112 (!) 182/104  Pulse: 67 74 71 70  Resp:    14  Temp: 98.7 F (37.1 C) 98.4 F (36.9 C) 98 F (36.7 C) 99.1 F (37.3 C)  TempSrc: Oral Oral Oral Oral  SpO2: 96% 97% 98% 96%  Weight:      Height:        CBC:  Recent Labs  Lab 10/20/18 1655 10/21/18 0451 10/22/18 0537  WBC 8.9  9.2 7.7 9.8  NEUTROABS 5.7  5.9  --   --   HGB 16.4  16.3 14.9 15.5  HCT 48.8  48.4 43.7 46.5  MCV 90.4  89.3 88.5 88.7  PLT 306  313 257 [id number]    Basic Metabolic Panel:  Recent Labs  Lab 10/20/18 1655 10/21/18 0451 10/22/18 0537  NA 137  137 137 138  K 4.7  4.5 3.6 4.0  CL 103  103 102 102  CO2 24  24 22 27   GLUCOSE 94  91 140* 91  BUN 19  18 16 14   CREATININE 1.38*  1.58* 1.39* 1.26*  CALCIUM 9.4  9.3 8.7* 9.2  MG 2.3  2.3  --   --     Lipid Panel:     Component Value Date/Time  CHOL 172 10/21/2018 0451   TRIG 101 10/21/2018 0451   HDL 36 (L) 10/21/2018 0451   CHOLHDL 4.8 10/21/2018 0451   VLDL 20 10/21/2018 0451   LDLCALC 116 (H) 10/21/2018 0451   HgbA1c:  Lab Results  Component Value Date   HGBA1C 5.1 10/21/2018   Urine Drug Screen: No results found for: LABOPIA, COCAINSCRNUR, LABBENZ, AMPHETMU, THCU, LABBARB  Alcohol Level     Component Value Date/Time   ETH <10 10/20/2018 1801    IMAGING  Ct Head Wo Contrast 10/20/2018 IMPRESSION:  1. Age indeterminate RIGHT basal ganglia/internal capsule which may be subacute. No evidence of hemorrhage.  2. 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   Mr Angio Head Wo Contrast 10/21/2018 IMPRESSION:  Normal examination of the intracranial large and  medium sized vessels. Mild atherosclerotic irregularity of the distal branch vessels.   Mr Brain Wo Contrast 10/21/2018 IMPRESSION:  Small right basal ganglia acute/early subacute infarct without hemorrhage or mass effect.     Transthoracic Echocardiogram  00/00/2020 Pending Recent Results (from the past 43800 hour(s))  ECHOCARDIOGRAM COMPLETE   Collection Time: 10/21/18  4:30 PM  Result Value   Weight 3,039.98   Height 66   BP 142/99   Narrative     ECHOCARDIOGRAM REPORT       Patient Name:   John Joseph Date of Exam: 10/21/2018 Medical Rec #:  [id number]    Height:       66.0 in Accession #:    [id number]   Weight:       190.0 lb Date of Birth:  November 24, 1945   BSA:          1.96 m Patient Age:    57 years     BP:           142/99 mmHg Patient Gender: M            HR:           103 bpm. Exam Location:  Inpatient    Procedure: 2D Echo  Indications:    Stroke 434.91 / I163.9   History:        Patient has no prior history of Echocardiogram examinations.                 Stroke Risk Factors: Hypertension and Non-Smoker. Left facial                 droop, frontal headache and increased confusion.   Sonographer:    Leavy Cella Referring Phys: [place]    1. The left ventricle has normal systolic function with an ejection fraction of 60-65%. The cavity size was normal. There is moderately increased left ventricular wall thickness. Left ventricular diastolic Doppler parameters are consistent with impaired  relaxation. No evidence of left ventricular regional wall motion abnormalities.  2. The right ventricle has normal systolic function. The cavity was normal. There is no increase in right ventricular wall thickness.  3. The mitral valve is grossly normal.  4. The aortic valve was not well visualized. No stenosis of the aortic valve.  5. The aorta is normal unless otherwise noted.  SUMMARY   LVEF 60-65%, moderate LVH, normal wall motion,  grade 1 DD, indeterminate LV filling pressure, normal biatrial size, negative for PFO by color doppler, normal IVC  FINDINGS  Left Ventricle: The left ventricle has normal systolic function, with an ejection fraction of 60-65%. The cavity  size was normal. There is moderately increased left ventricular wall thickness. Left ventricular diastolic Doppler parameters are consistent  with impaired relaxation. Normal left ventricular filling pressures No evidence of left ventricular regional wall motion abnormalities..  Right Ventricle: The right ventricle has normal systolic function. The cavity was normal. There is no increase in right ventricular wall thickness.  Left Atrium: Left atrial size was normal in size.  Right Atrium: Right atrial size was normal in size. Right atrial pressure is estimated at 10 mmHg.  Interatrial Septum: No atrial level shunt detected by color flow Doppler.  Pericardium: There is no evidence of pericardial effusion.  Mitral Valve: The mitral valve is grossly normal. Mitral valve regurgitation is not visualized by color flow Doppler.  Tricuspid Valve: The tricuspid valve is not well visualized. Tricuspid valve regurgitation was not visualized by color flow Doppler.  Aortic Valve: The aortic valve was not well visualized Aortic valve regurgitation was not visualized by color flow Doppler. There is No stenosis of the aortic valve.  Pulmonic Valve: The pulmonic valve was not well visualized. Pulmonic valve regurgitation is not visualized by color flow Doppler.  Aorta: The aorta is normal unless otherwise noted.  Venous: The inferior vena cava is normal in size with greater than 50% respiratory variability.    +--------------+--------++ LEFT VENTRICLE         +----------------+---------++ +--------------+--------++ Diastology                PLAX 2D                +----------------+---------++ +--------------+--------++ LV e' lateral:  9.03  cm/s LVIDd:        4.60 cm  +----------------+---------++ +--------------+--------++ LV E/e' lateral:5.9       LVIDs:        3.10 cm  +----------------+---------++ +--------------+--------++ LV e' medial:   7.07 cm/s LV PW:        1.20 cm  +----------------+---------++ +--------------+--------++ LV E/e' medial: 7.5       LV IVS:       1.30 cm  +----------------+---------++ +--------------+--------++ LVOT diam:    2.00 cm  +--------------+--------++ LV SV:        59 ml    +--------------+--------++ LV SV Index:  29.27    +--------------+--------++ LVOT Area:    3.14 cm +--------------+--------++                        +--------------+--------++  +---------------+------++ RIGHT VENTRICLE       +---------------+------++ TAPSE (M-mode):2.4 cm +---------------+------++  +---------------+-------++-----------++ LEFT ATRIUM           Index       +---------------+-------++-----------++ LA diam:       3.10 cm1.58 cm/m  +---------------+-------++-----------++ LA Vol (A2C):  27.1 ml13.85 ml/m +---------------+-------++-----------++ LA Vol (A4C):  37.9 ml19.37 ml/m +---------------+-------++-----------++ LA Biplane Vol:32.7 ml16.71 ml/m +---------------+-------++-----------++ +------------+---------++-----------++ RIGHT ATRIUM         Index       +------------+---------++-----------++ RA Pressure:3.00 mmHg            +------------+---------++-----------++ RA Area:    11.40 cm            +------------+---------++-----------++ RA Volume:  24.00 ml 12.27 ml/m +------------+---------++-----------++    +-------------+-------++ AORTA                +-------------+-------++ Ao Root diam:3.30 cm +-------------+-------++  +--------------+----------++ +---------------+---------++ MITRAL VALVE             TRICUSPID VALVE           +--------------+----------++ +---------------+---------++  MV Area (PHT):2.52 cm   Estimated RAP: 3.00 mmHg +--------------+----------++ +---------------+---------++ MV PHT:       87.29 msec +--------------+----------++ +--------------+-------+ MV Decel Time:301 msec   SHUNTS                +--------------+----------++ +--------------+-------+ +--------------+----------++ Systemic Diam:2.00 cm MV E velocity:53.10 cm/s +--------------+-------+ +--------------+----------++ MV A velocity:92.00 cm/s +--------------+----------++ MV E/A ratio: 0.58       +--------------+----------++    Lyman Bishop MD Electronically signed by Lyman Bishop MD Signature Date/Time: 10/21/2018/5:24:04 PM       Final     *Note: Due to a large number of results and/or encounters for the requested time period, some results have not been displayed. A complete set of results can be found in Results Review.   Echo: LVEF 60-65%, moderate LVH, normal wall motion, grade 1 DD, indeterminate LV filling pressure, normal biatrial size, negative for PFO by color doppler, normal IVC  Bilateral Carotid Dopplers  00/00/2020 Prelim No significant flow-limiting lesions  ECG - SR rate 71 BPM. (See cardiology reading for complete details)  PHYSICAL EXAM Blood pressure (!) 182/104, pulse 70, temperature 99.1 F (37.3 C), temperature source Oral, resp. rate 14, height 5\' 6"  (1.676 m), weight 86.2 kg, SpO2 96 %.  Exam: NAD, pleasant                  Speech:    Speech is normal; fluent and spontaneous with normal comprehension. Patient today alert, oriented x 4, asking appropriate questions, knowledgeable about his medical conditions.  Cognition:    The patient is oriented to person, place, and time;     recent memory impaired;     language fluent;    Cranial Nerves:    The pupils are equal, round, and reactive to light.Trigeminal sensation is intact and the muscles of mastication  are normal. The face has a very mild left droop. Facial sensation intact. The palate elevates in the midline. Hearing intact to voice. Voice is normal. Shoulder shrug is normal. The tongue has normal motion without fasciculations.   Coordination:  No dysmetria  Motor Observation:    No asymmetry, no atrophy, and no involuntary movements noted. Tone:    Normal muscle tone.     Strength:    Strength is V/V in the upper and lower limbs.      Sensation: intact to LT   ASSESSMENT/PLAN Mr. Barett Sobrino is a 73 y.o. male with history of hypertension, gout and hx of headaches presenting with facial droop and left sided numbness. 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   MRI head - Small right basal ganglia acute/early subacute infarct   MRA head - normal  CTA H&N - not ordered  CT Perfusion - not ordered  Carotid Doppler - prelim without significant flow limiting stenoses  2D Echo - LVEF 60-65%, moderate LVH, normal wall motion, grade 1 DD, indeterminate LV filling pressure, normal biatrial size, negative  for PFO by color doppler, normal IVC   [employer] Virus 2 - negative  LDL - 116  HgbA1c - 5.1  UDS - not  ordered  VTE prophylaxis - Lovenox Diet  Diet Order            Diet Heart Room service appropriate? Yes; Fluid consistency: Thin  Diet effective now              ASA 325mg  prior to admission, now on aspirin 325 mg daily  Patient counseled to be compliant with his antithrombotic medications. Needs Dual Antiplatelet therapy for 3 weeks then Plavix 75mg  alone.    Ongoing aggressive stroke risk factor management  Therapy recommendations:  pending  Disposition:  Pending  Hypertension  Home BP meds: metoprolol XL 100 mg daily  Current BP meds: metoprolol XL 100 mg daily  Stable . Permissive hypertension (OK if < 220/120) but gradually normalize in 5-7 days  . Long-term BP goal normotensive  Hyperlipidemia  Home Lipid lowering medication:  none  LDL 116, goal < 70  Current lipid lowering medication: Lipitor 40 mg daily   Continue statin at discharge   Other Stroke Risk Factors  Advanced age  Obesity, Body mass index is 30.67 kg/m., recommend weight loss, diet and exercise as appropriate   Hx stroke (by CT)  Family hx stroke - unknown   Other Active Problems  Creatinine - 1.26  PLAN  Needs outpt.sleep study for possible OSA causing morning headaches (he snores, excessive daytime fatigue, wakes with headaches daily). Please refer to Dr. Asencion Partridge Dohmeier at [facility]for sleep study and evaluation first available appointment.  Refer to Janett Billow, stroke RN at [facility] for stroke follow up in 4-6 weeks  Needs Dual Antiplatelet therapy for 3 weeks then Plavix 75mg  alone. If echo and carotid dopplers unremarkable, stroke will sign off. Thanks  Patient today alert, oriented x 4, asking appropriate questions, knowledgeable about his medical conditions, good historian, confusion appears resolved. Neuro not concerned at this time for other etiology such as vasculitis (given headache). Stroke will sign off but if Wife and Daughter, who are visiting today, feel otherwise please contact stroke team.   Hospital day # 0  Personally examined patient and images, and have participated in and made any corrections needed to history, physical, neuro exam,assessment and plan as stated above.  I have personally obtained the history, evaluated lab date, reviewed imaging studies and agree with radiology interpretations.     Sarina Ill, MD Stroke Neurology   A total of 25 minutes was spent for the care of this patient, spent on counseling patient or family on different diagnostic and therapeutic options, counseling and coordination of care, risks and benefits of management, compliance, or risk factor reduction and education.   To contact Stroke Continuity provider, please refer to [email or web address] After hours, contact General Neurology

## 2018-10-22 NOTE — Discharge Summary (Signed)
Physician Discharge Summary  John Joseph [id number] DOB: 27-Feb-1945 DOA: 10/20/2018  PCP: John Jordan, MD  Admit date: 10/20/2018 Discharge date: 10/22/2018  Admitted From: Home  Disposition:  Home   Recommendations for Outpatient Follow-up:  1. Follow up with PCP in 1 week for BP check 2. Please follow up with Neurology for stroke in 4-6 weeks 3. Please follow up with John Joseph for sleep evaluation for OSA 4. Consider referral to Geriatrics for dementia evaluation    Home Health: None  Equipment/Devices: None  Discharge Condition: Good  CODE STATUS: FULL Diet recommendation: Cardiac  Brief/Interim Summary: John Joseph is a 73 y.o. M with HTN who presented with left facial droop, and left-sided numbness.  The patient was an inconsistent historian, but seem to suggest that he had had some left-sided paresthesias for several days prior to admission, and then had left-sided facial droop noticed by his wife acutely on the day of admission.  In the ER he had a CT head without contrast that showed a right internal capsule lucency and he was transferred to [facility] for suspicion of stroke.     PRINCIPAL HOSPITAL DIAGNOSIS:  Acute right basal ganglia ischemic stroke        Discharge Diagnoses:   Acute right basal ganglia ischemic stroke -Non-invasive angiography with MRA head showed no evidence of atherosclerosis or vasculitis.   -Echocardiogram showed no cardiogenic source of embolism -Carotid ultrasound imaging unremarkable   -Lipids ordered: discharged on atorvastatin 40 mg -Aspirin ordered at admission --> discharged on DAPT for 3 weeks followed by Plavix indefinitely -Atrial fibrillation: not present on telemetry monitoring in the hospital -tPA not given because outside the stroke window -Dysphagia screen ordered in ER -PT eval ordered: recommended No PT follow up -Non-smoker    Hypertensive urgency Metoprolol changed to carvedilol.  Amlodipine added.   Patient has multiple medication sensitivities.  Adherence and blood pressure control emphasized.  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  This appeared to be independent of his acute stroke, and was possibly acute hypertensive encephalopathy that was resolving, but also could be the manifestation of dementia. -Recommend outpatient geriatrics evaluation or referral for neuropsychiatric testing for evaluation of mild cognitive impairment/early dementia  Suspected sleep apnea Patient reported daily morning headaches, hypertension, lack of restful sleep. -Recommend outpatient sleep study           Discharge Instructions  Discharge Instructions    Ambulatory referral to Neurology   Complete by: As directed    An appointment is requested in approximately: first available For evaluation for sleep apnea   Ambulatory referral to Neurology   Complete by: As directed    An appointment is requested in approximately: 4 weeks For stroke follow up   Diet - low sodium heart healthy   Complete by: As directed    Discharge instructions   Complete by: As directed    From Dr. Loleta Joseph: You were admitted with a stroke and high blood pressure.  We know that after strokes, people prevent future strokes in three primary ways: Take an anti-platelet (anti-clot) medicine Control blood pressure Lower cholesterol   For this reason, you should take aspirin 81 mg daily and clopidogrel/Plavix 75 mg daily for 3 weeks then CONTINUE PLAVIX indefinitely  Also: stop metoprolol and replace it with carvedilol/Coreg 12.5 mg twice daily (this is a cousin of metoprolol, lowers blood pressure better) Also  add amlodipine/Norvasc 2.5 mg I hope that the lower dose and taking it in the evening will help avoid leg swelling   Lastly, take  atorvastatin/Lipitor for cholesterol at night    I have referred you to [facility] for stroke follow up and sleep evaluation. They will contact you for an appointment, but their number is below if you haven't heard from them. Feel free to contact John Joseph for an alternate referral if desired. Just know that we recommend you have a stroke follow up within 4-6 weeks   Finally, I recommend to John Joseph that you have a geriatric evaluation or neuropsychiatric testing for evidence of memory loss.  This can be done in [place] or [place].   Increase activity slowly   Complete by: As directed      Allergies as of 10/22/2018      Reactions   Prednisone Other (See Comments)   Keflex [cephalexin] Other (See Comments)   Abdominal cramps      Medication List    STOP taking these medications   metoprolol succinate 100 MG 24 hr tablet Commonly known as: TOPROL-XL     TAKE these medications   amLODipine 2.5 MG tablet Commonly known as: NORVASC Take 1 tablet (2.5 mg total) by mouth daily at 6 PM.   aspirin EC 81 MG tablet Take 1 tablet (81 mg total) by mouth daily.   atorvastatin 40 MG tablet Commonly known as: LIPITOR Take 1 tablet (40 mg total) by mouth daily at 6 PM.   carvedilol 12.5 MG tablet Commonly known as: COREG Take 1 tablet (12.5 mg total) by mouth 2 (two) times daily with a meal.   clopidogrel 75 MG tablet Commonly known as: Plavix Take 1 tablet (75 mg total) by mouth daily.   colchicine 0.6 MG tablet Take 0.6 mg by mouth daily as needed (gout).      Follow-up Information    John Jordan, MD Follow up.   Specialty: Family Medicine Why: Call John Joseph for a follow up appointment in 1-2 weeks Contact information: [place] [address] [phone]          Allergies  Allergen Reactions  . Prednisone Other (See Comments)  . Keflex [Cephalexin] Other (See Comments)    Abdominal cramps     Consultations:  Neurology   Procedures/Studies: Ct Head Wo Contrast  Result Date: 10/20/2018 CLINICAL DATA:  73 year old male with headache facial numbness for 4 days. Confusion for 1 day. EXAM: CT HEAD WITHOUT CONTRAST TECHNIQUE: Contiguous axial images were obtained from the base of the skull through the vertex without intravenous contrast. COMPARISON:  None. FINDINGS: Brain: An age indeterminate infarct within the RIGHT basal ganglia/genu of the internal capsule may be subacute. There is no evidence of hemorrhage. 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 There is no evidence of midline shift, mass, extra-axial collection or hydrocephalus. Vascular: Atherosclerotic calcifications noted. Skull: Normal. Negative for fracture or focal lesion. Sinuses/Orbits: No acute finding. Other: None. IMPRESSION: 1. Age indeterminate RIGHT basal ganglia/internal capsule which may be subacute. No evidence of hemorrhage. 2. 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 Electronically Signed   By: Margarette Canada M.D.   On: 10/20/2018 18:44   Mr Angio Head Wo Contrast  Result Date: 10/21/2018 CLINICAL DATA:  Acute infarction right basal ganglia.  Follow-up. EXAM: MRA HEAD WITHOUT CONTRAST TECHNIQUE: Angiographic images  of the Circle of Willis were obtained using MRA technique without intravenous contrast. COMPARISON:  MRI same day FINDINGS: Both internal carotid arteries are widely patent into the brain. No siphon stenosis. The anterior and middle cerebral vessels are patent without proximal stenosis, aneurysm or vascular malformation. Distal vessels do show some atherosclerotic irregularity. Both vertebral arteries are widely patent to the basilar. No basilar stenosis. Posterior circulation branch vessels appear normal. IMPRESSION: Normal examination of the intracranial  large and medium sized vessels. Mild atherosclerotic irregularity of the distal branch vessels. Electronically Signed   By: Nelson Chimes M.D.   On: 10/21/2018 09:40   Mr Brain Wo Contrast  Result Date: 10/21/2018 CLINICAL DATA:  Confusion and difficulty speaking. EXAM: MRI HEAD WITHOUT CONTRAST TECHNIQUE: Multiplanar, multiecho pulse sequences of the brain and surrounding structures were obtained without intravenous contrast. COMPARISON:  Head CT 10/20/2018 FINDINGS: BRAIN: There is a small right basal ganglia infarct. No hemorrhage or mass effect. Multifocal white matter hyperintensity, most commonly due to chronic ischemic microangiopathy. There is generalized atrophy without lobar predilection. The midline structures are normal. VASCULAR: The major intracranial arterial and venous sinus flow voids are normal. Susceptibility-sensitive sequences show no chronic microhemorrhage or superficial siderosis. SKULL AND UPPER CERVICAL SPINE: Calvarial bone marrow signal is normal. There is no skull base mass. The visualized upper cervical spine and soft tissues are normal. SINUSES/ORBITS: There are no fluid levels or advanced mucosal thickening. The mastoid air cells and middle ear cavities are free of fluid. The orbits are normal. IMPRESSION: Small right basal ganglia acute/early subacute infarct without hemorrhage or mass effect. Electronically Signed   By: Ulyses Jarred M.D.   On: 10/21/2018 02:15   Vas US Carotid (at [place] Only)  Result Date: 10/21/2018 Carotid Arterial Duplex Study Indications:       CVA and Speech disturbance. Risk Factors:      Hypertension. Other Factors:     Headache, uncontrolled hypertension. Hypertensive emergency. Comparison Study:  No prior study on file for comparison Performing Technologist: Sharion Dove RVS  Examination Guidelines: A complete evaluation includes B-mode imaging, spectral Doppler, color Doppler, and power Doppler as needed of all accessible portions of each  vessel. Bilateral testing is considered an integral part of a complete examination. Limited examinations for reoccurring indications may be performed as noted.  Right Carotid Findings: +----------+--------+--------+--------+------------------+------------------+           PSV cm/sEDV cm/sStenosisPlaque DescriptionComments           +----------+--------+--------+--------+------------------+------------------+ CCA Prox  86      18                                intimal thickening +----------+--------+--------+--------+------------------+------------------+ CCA Distal75      21                                intimal thickening +----------+--------+--------+--------+------------------+------------------+ ICA Prox  44      13                                                   +----------+--------+--------+--------+------------------+------------------+ ICA Distal53      19                                                   +----------+--------+--------+--------+------------------+------------------+  ECA       81      15                                                   +----------+--------+--------+--------+------------------+------------------+ +----------+--------+-------+--------+-------------------+           PSV cm/sEDV cmsDescribeArm Pressure (mmHG) +----------+--------+-------+--------+-------------------+ [id number]                                         +----------+--------+-------+--------+-------------------+ +---------+--------+--+--------+--+ VertebralPSV cm/s80EDV cm/s16 +---------+--------+--+--------+--+  Left Carotid Findings: +----------+--------+--------+--------+------------------+------------------+           PSV cm/sEDV cm/sStenosisPlaque DescriptionComments           +----------+--------+--------+--------+------------------+------------------+ CCA Prox  103     20                                intimal thickening  +----------+--------+--------+--------+------------------+------------------+ CCA Distal83      18                                intimal thickening +----------+--------+--------+--------+------------------+------------------+ ICA Prox  55      20                                                   +----------+--------+--------+--------+------------------+------------------+ ICA Distal53      15                                                   +----------+--------+--------+--------+------------------+------------------+ ECA       76      13                                                   +----------+--------+--------+--------+------------------+------------------+ +----------+--------+--------+--------+-------------------+           PSV cm/sEDV cm/sDescribeArm Pressure (mmHG) +----------+--------+--------+--------+-------------------+ Subclavian120                                         +----------+--------+--------+--------+-------------------+ +---------+--------+---+--------+--+ VertebralPSV cm/s115EDV cm/s25 +---------+--------+---+--------+--+  Summary: Right Carotid: The extracranial vessels were near-normal with only minimal wall                thickening or plaque. Left Carotid: The extracranial vessels were near-normal with only minimal wall               thickening or plaque. Vertebrals:  Bilateral vertebral arteries demonstrate antegrade flow. Subclavians: Normal flow hemodynamics were seen in bilateral subclavian              arteries. *See table(s) above for measurements and observations.     Preliminary  Subjective: Headache this morning again.  Facial droop and paresthesias resolved.  No focal weakness.  No seizures, vision changes, hallucinations.  Discharge Exam: Vitals:   10/22/18 0326 10/22/18 0827  BP: (!) 174/112 (!) 182/104  Pulse: 71 70  Resp:  14  Temp: 98 F (36.7 C) 99.1 F (37.3 C)  SpO2: 98% 96%   Vitals:    10/21/18 2013 10/22/18 0000 10/22/18 0326 10/22/18 0827  BP: (!) 154/97 (!) 156/112 (!) 174/112 (!) 182/104  Pulse: 67 74 71 70  Resp:    14  Temp: 98.7 F (37.1 C) 98.4 F (36.9 C) 98 F (36.7 C) 99.1 F (37.3 C)  TempSrc: Oral Oral Oral Oral  SpO2: 96% 97% 98% 96%  Weight:      Height:        General: Pt is alert, awake, not in acute distress Cardiovascular: RRR, nl S1-S2, no murmurs appreciated.   No LE edema.   Respiratory: Normal respiratory rate and rhythm.  CTAB without rales or wheezes. Abdominal: Abdomen soft and non-tender.  No distension or HSM.   Neuro/Psych: Strength symmetric in upper and lower extremities.  Judgment and insight appear slightly impaired, thought processes are tangential.   The results of significant diagnostics from this hospitalization (including imaging, microbiology, ancillary and laboratory) are listed below for reference.     Microbiology: Recent Results (from the past 240 hour(s))  SARS CORONAVIRUS 2 (TAT 6-12 HRS) Nasal Swab Aptima Multi Swab     Status: None   Collection Time: 10/21/18  2:43 AM   Specimen: Aptima Multi Swab; Nasal Swab  Result Value Ref Range Status   SARS Coronavirus 2 NEGATIVE NEGATIVE Final    Comment: (NOTE) SARS-CoV-2 target nucleic acids are NOT DETECTED. The SARS-CoV-2 RNA is generally detectable in upper and lower respiratory specimens during the acute phase of infection. Negative results do not preclude SARS-CoV-2 infection, do not rule out co-infections with other pathogens, and should not be used as the sole basis for treatment or other patient management decisions. Negative results must be combined with clinical observations, patient history, and epidemiological information. The expected result is Negative. Fact Sheet for Patients: SugarRoll.be Fact Sheet for Healthcare Providers: https://www.woods-mathews.com/ This test is not yet approved or cleared by the [place] FDA and  has been authorized for detection and/or diagnosis of SARS-CoV-2 by FDA under an Emergency Use Authorization (EUA). This EUA will remain  in effect (meaning this test can be used) for the duration of the COVID-19 declaration under Section 56 4(b)(1) of the Act, 21 U.S.C. section 360bbb-3(b)(1), unless the authorization is terminated or revoked sooner. Performed at [facility], [place] [address], [place], [place] [address]      Labs: BNP (last 3 results) No results for input(s): BNP in the last 8760 hours. Basic Metabolic Panel: Recent Labs  Lab 10/20/18 1655 10/21/18 0451 10/22/18 0537  NA 137  137 137 138  K 4.7  4.5 3.6 4.0  CL 103  103 102 102  CO2 24  24 22 27   GLUCOSE 94  91 140* 91  BUN 19  18 16 14   CREATININE 1.38*  1.58* 1.39* 1.26*  CALCIUM 9.4  9.3 8.7* 9.2  MG 2.3  2.3  --   --    Liver Function Tests: Recent Labs  Lab 10/21/18 0451  AST 22  ALT 18  ALKPHOS 76  BILITOT 0.9  PROT 5.8*  ALBUMIN 3.4*   No results for input(s): LIPASE, AMYLASE in  the last 168 hours. No results for input(s): AMMONIA in the last 168 hours. CBC: Recent Labs  Lab 10/20/18 1655 10/21/18 0451 10/22/18 0537  WBC 8.9  9.2 7.7 9.8  NEUTROABS 5.7  5.9  --   --   HGB 16.4  16.3 14.9 15.5  HCT 48.8  48.4 43.7 46.5  MCV 90.4  89.3 88.5 88.7  PLT 306  313 257 262   Cardiac Enzymes: No results for input(s): CKTOTAL, CKMB, CKMBINDEX, TROPONINI in the last 168 hours. BNP: Invalid input(s): POCBNP CBG: No results for input(s): GLUCAP in the last 168 hours. D-Dimer No results for input(s): DDIMER in the last 72 hours. Hgb A1c Recent Labs    10/21/18 0451  HGBA1C 5.1   Lipid Profile Recent Labs    10/21/18 0451  CHOL 172  HDL 36*  LDLCALC 116*  TRIG 101  CHOLHDL 4.8   Thyroid function studies No results for input(s): TSH, T4TOTAL, T3FREE, THYROIDAB in the last 72 hours.  Invalid input(s): FREET3 Anemia work up No results for  input(s): VITAMINB12, FOLATE, FERRITIN, TIBC, IRON, RETICCTPCT in the last 72 hours. Urinalysis    Component Value Date/Time   COLORURINE YELLOW 10/20/2018 1801   APPEARANCEUR CLEAR 10/20/2018 1801   LABSPEC 1.020 10/20/2018 1801   PHURINE 5.0 10/20/2018 1801   GLUCOSEU NEGATIVE 10/20/2018 1801   HGBUR NEGATIVE 10/20/2018 1801   BILIRUBINUR NEGATIVE 10/20/2018 1801   KETONESUR NEGATIVE 10/20/2018 1801   PROTEINUR 100 (A) 10/20/2018 1801   NITRITE NEGATIVE 10/20/2018 1801   LEUKOCYTESUR NEGATIVE 10/20/2018 1801   Sepsis Labs Invalid input(s): PROCALCITONIN,  WBC,  LACTICIDVEN Microbiology Recent Results (from the past 240 hour(s))  SARS CORONAVIRUS 2 (TAT 6-12 HRS) Nasal Swab Aptima Multi Swab     Status: None   Collection Time: 10/21/18  2:43 AM   Specimen: Aptima Multi Swab; Nasal Swab  Result Value Ref Range Status   SARS Coronavirus 2 NEGATIVE NEGATIVE Final    Comment: (NOTE) SARS-CoV-2 target nucleic acids are NOT DETECTED. The SARS-CoV-2 RNA is generally detectable in upper and lower respiratory specimens during the acute phase of infection. Negative results do not preclude SARS-CoV-2 infection, do not rule out co-infections with other pathogens, and should not be used as the sole basis for treatment or other patient management decisions. Negative results must be combined with clinical observations, patient history, and epidemiological information. The expected result is Negative. Fact Sheet for Patients: SugarRoll.be Fact Sheet for Healthcare Providers: https://www.woods-mathews.com/ This test is not yet approved or cleared by the [place] FDA and  has been authorized for detection and/or diagnosis of SARS-CoV-2 by FDA under an Emergency Use Authorization (EUA). This EUA will remain  in effect (meaning this test can be used) for the duration of the COVID-19 declaration under Section 56 4(b)(1) of the Act, 21  U.S.C. section 360bbb-3(b)(1), unless the authorization is terminated or revoked sooner. Performed at [facility], [place] [address], [place], [place] [address]      Time coordinating discharge: 40 minutes      SIGNED:   Edwin Dada, MD  [facility] 10/22/2018, 12:16 PM

## 2018-10-22 NOTE — Progress Notes (Signed)
Patient educated on discharge instructions. Questions answered. Patient taken in wheelchair to be picked up at [facility].

## 2018-10-22 NOTE — Plan of Care (Signed)
  Problem: Education: Goal: Knowledge of General Education information will improve Description: Including pain rating scale, medication(s)/side effects and non-pharmacologic comfort measures Outcome: Adequate for Discharge   Problem: Health Behavior/Discharge Planning: Goal: Ability to manage health-related needs will improve Outcome: Adequate for Discharge   Problem: Clinical Measurements: Goal: Ability to maintain clinical measurements within normal limits will improve Outcome: Adequate for Discharge Goal: Will remain free from infection Outcome: Adequate for Discharge Goal: Diagnostic test results will improve Outcome: Adequate for Discharge Goal: Respiratory complications will improve Outcome: Adequate for Discharge Goal: Cardiovascular complication will be avoided Outcome: Adequate for Discharge   Problem: Activity: Goal: Risk for activity intolerance will decrease Outcome: Adequate for Discharge   Problem: Nutrition: Goal: Adequate nutrition will be maintained Outcome: Adequate for Discharge   Problem: Coping: Goal: Level of anxiety will decrease Outcome: Adequate for Discharge   Problem: Elimination: Goal: Will not experience complications related to bowel motility Outcome: Adequate for Discharge Goal: Will not experience complications related to urinary retention Outcome: Adequate for Discharge   Problem: Pain Managment: Goal: General experience of comfort will improve Outcome: Adequate for Discharge   Problem: Safety: Goal: Ability to remain free from injury will improve Outcome: Adequate for Discharge   Problem: Skin Integrity: Goal: Risk for impaired skin integrity will decrease Outcome: Adequate for Discharge   Problem: Education: Goal: Knowledge of disease or condition will improve Outcome: Adequate for Discharge Goal: Knowledge of secondary prevention will improve Outcome: Adequate for Discharge Goal: Knowledge of patient specific risk factors  addressed and post discharge goals established will improve Outcome: Adequate for Discharge Goal: Individualized Educational Video(s) Outcome: Adequate for Discharge   Problem: Coping: Goal: Will verbalize positive feelings about self Outcome: Adequate for Discharge Goal: Will identify appropriate support needs Outcome: Adequate for Discharge   Problem: Health Behavior/Discharge Planning: Goal: Ability to manage health-related needs will improve Outcome: Adequate for Discharge   Problem: Self-Care: Goal: Ability to participate in self-care as condition permits will improve Outcome: Adequate for Discharge Goal: Verbalization of feelings and concerns over difficulty with self-care will improve Outcome: Adequate for Discharge Goal: Ability to communicate needs accurately will improve Outcome: Adequate for Discharge   Problem: Nutrition: Goal: Risk of aspiration will decrease Outcome: Adequate for Discharge Goal: Dietary intake will improve Outcome: Adequate for Discharge   Problem: Intracerebral Hemorrhage Tissue Perfusion: Goal: Complications of Intracerebral Hemorrhage will be minimized Outcome: Adequate for Discharge   Problem: Ischemic Stroke/TIA Tissue Perfusion: Goal: Complications of ischemic stroke/TIA will be minimized Outcome: Adequate for Discharge   Problem: Spontaneous Subarachnoid Hemorrhage Tissue Perfusion: Goal: Complications of Spontaneous Subarachnoid Hemorrhage will be minimized Outcome: Adequate for Discharge   

## 2018-10-22 NOTE — Care Management Obs Status (Signed)
[facility] NOTIFICATION   Patient Details  Name: John Joseph MRN: [id number] Date of Birth: 22-Jan-1946   Medicare Observation Status Notification Given:  Yes    Carles Collet, RN 10/22/2018, 12:45 PM

## 2018-11-30 ENCOUNTER — Telehealth: Payer: Self-pay

## 2018-11-30 ENCOUNTER — Inpatient Hospital Stay: Payer: Medicare Other | Admitting: Adult Health

## 2018-11-30 NOTE — Telephone Encounter (Signed)
Patient was a no call/no show for their appointment today.   
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
## 2019-05-17 ENCOUNTER — Encounter: Payer: Self-pay | Admitting: Neurology

## 2019-05-17 ENCOUNTER — Other Ambulatory Visit: Payer: Self-pay

## 2019-05-17 ENCOUNTER — Ambulatory Visit: Payer: Medicare Other | Admitting: Neurology

## 2019-05-17 VITALS — BP 162/98 | HR 63 | Resp 18 | Ht 66.0 in | Wt 183.0 lb

## 2019-05-17 DIAGNOSIS — F0150 Vascular dementia without behavioral disturbance: Secondary | ICD-10-CM

## 2019-05-17 DIAGNOSIS — I10 Essential (primary) hypertension: Secondary | ICD-10-CM | POA: Diagnosis not present

## 2019-05-17 DIAGNOSIS — F01A0 Vascular dementia, mild, without behavioral disturbance, psychotic disturbance, mood disturbance, and anxiety: Secondary | ICD-10-CM

## 2019-05-17 DIAGNOSIS — F4323 Adjustment disorder with mixed anxiety and depressed mood: Secondary | ICD-10-CM | POA: Diagnosis not present

## 2019-05-17 DIAGNOSIS — I639 Cerebral infarction, unspecified: Secondary | ICD-10-CM | POA: Diagnosis not present

## 2019-05-17 MED ORDER — ESCITALOPRAM OXALATE 10 MG PO TABS: 10.0000 mg | ORAL_TABLET | Freq: Every day | ORAL | 5 refills | Status: DC

## 2019-05-17 NOTE — Patient Instructions (Signed)
1.  Continue Plavix and atorvastatin 2.  Follow up with Dr. Stephanie Acre regarding high blood pressure 3.  For depression/anxiety, start escitalopram 10mg  daily 4.  I will refer you for neurocognitive testing.  5.  I would not drive.  We will see what the neurocognitive testing shows 6.  Follow a Mediterranean diet (see below) 7.  Start routine exercise 8.  Follow up in 6 months   Mediterranean Diet A Mediterranean diet refers to food and lifestyle choices that are based on the traditions of countries located on the [employer]. This way of eating has been shown to help prevent certain conditions and improve outcomes for people who have chronic diseases, like kidney disease and heart disease. What are tips for following this plan? Lifestyle  Cook and eat meals together with your family, when possible.  Drink enough fluid to keep your urine clear or pale yellow.  Be physically active every day. This includes: ? Aerobic exercise like running or swimming. ? Leisure activities like gardening, walking, or housework.  Get 7-8 hours of sleep each night.  If recommended by your health care provider, drink red wine in moderation. This means 1 glass a day for nonpregnant women and 2 glasses a day for men. A glass of wine equals 5 oz (150 mL). Reading food labels   Check the serving size of packaged foods. For foods such as rice and pasta, the serving size refers to the amount of cooked product, not dry.  Check the total fat in packaged foods. Avoid foods that have saturated fat or trans fats.  Check the ingredients list for added sugars, such as corn syrup. Shopping  At the grocery store, buy most of your food from the areas near the walls of the store. This includes: ? Fresh fruits and vegetables (produce). ? Grains, beans, nuts, and seeds. Some of these may be available in unpackaged forms or large amounts (in bulk). ? Fresh seafood. ? Poultry and eggs. ? Low-fat dairy  products.  Buy whole ingredients instead of prepackaged foods.  Buy fresh fruits and vegetables in-season from local farmers markets.  Buy frozen fruits and vegetables in resealable bags.  If you do not have access to quality fresh seafood, buy precooked frozen shrimp or canned fish, such as tuna, salmon, or sardines.  Buy small amounts of raw or cooked vegetables, salads, or olives from the deli or salad bar at your store.  Stock your pantry so you always have certain foods on hand, such as olive oil, canned tuna, canned tomatoes, rice, pasta, and beans. Cooking  Cook foods with extra-virgin olive oil instead of using butter or other vegetable oils.  Have meat as a side dish, and have vegetables or grains as your main dish. This means having meat in small portions or adding small amounts of meat to foods like pasta or stew.  Use beans or vegetables instead of meat in common dishes like chili or lasagna.  Experiment with different cooking methods. Try roasting or broiling vegetables instead of steaming or sauteing them.  Add frozen vegetables to soups, stews, pasta, or rice.  Add nuts or seeds for added healthy fat at each meal. You can add these to yogurt, salads, or vegetable dishes.  Marinate fish or vegetables using olive oil, lemon juice, garlic, and fresh herbs. Meal planning   Plan to eat 1 vegetarian meal one day each week. Try to work up to 2 vegetarian meals, if possible.  Eat seafood 2 or more times  a week.  Have healthy snacks readily available, such as: ? Vegetable sticks with hummus. ? [place] yogurt. ? Fruit and nut trail mix.  Eat balanced meals throughout the week. This includes: ? Fruit: 2-3 servings a day ? Vegetables: 4-5 servings a day ? Low-fat dairy: 2 servings a day ? Fish, poultry, or lean meat: 1 serving a day ? Beans and legumes: 2 or more servings a week ? Nuts and seeds: 1-2 servings a day ? Whole grains: 6-8 servings a day ? Extra-virgin  olive oil: 3-4 servings a day  Limit red meat and sweets to only a few servings a month What are my food choices?  Mediterranean diet ? Recommended  Grains: Whole-grain pasta. Brown rice. Bulgar wheat. Polenta. Couscous. Whole-wheat bread. [place].  Vegetables: Artichokes. Beets. Broccoli. Cabbage. Carrots. Eggplant. Green beans. Chard. Kale. Spinach. Onions. Leeks. Peas. Squash. Tomatoes. Peppers. Radishes.  Fruits: Apples. Apricots. Avocado. Berries. Bananas. Cherries. Dates. Figs. Grapes. Lemons. Melon. Oranges. Peaches. Plums. Pomegranate.  Meats and other protein foods: Beans. Almonds. Sunflower seeds. Pine nuts. Peanuts. [place]. Salmon. Scallops. Shrimp. [place]. Tilapia. Clams. Oysters. Eggs.  Dairy: Low-fat milk. Cheese. Greek yogurt.  Beverages: Water. Red wine. Herbal tea.  Fats and oils: Extra virgin olive oil. Avocado oil. Grape seed oil.  Sweets and desserts: [place] yogurt with honey. Baked apples. Poached pears. Trail mix.  Seasoning and other foods: Basil. Cilantro. Coriander. Cumin. Mint. Parsley. Sage. Rosemary. Tarragon. Garlic. Oregano. Thyme. Pepper. Balsalmic vinegar. Tahini. Hummus. Tomato sauce. Olives. Mushrooms. ? Limit these  Grains: Prepackaged pasta or rice dishes. Prepackaged cereal with added sugar.  Vegetables: Deep fried potatoes (french fries).  Fruits: Fruit canned in syrup.  Meats and other protein foods: Beef. Pork. Lamb. Poultry with skin. Hot dogs. Berniece Salines.  Dairy: Ice cream. Sour cream. Whole milk.  Beverages: Juice. Sugar-sweetened soft drinks. Beer. Liquor and spirits.  Fats and oils: Butter. Canola oil. Vegetable oil. Beef fat (tallow). Lard.  Sweets and desserts: Cookies. Cakes. Pies. Candy.  Seasoning and other foods: Mayonnaise. Premade sauces and marinades. The items listed may not be a complete list. Talk with your dietitian about what dietary choices are right for you. Summary  The Mediterranean diet includes both food and  lifestyle choices.  Eat a variety of fresh fruits and vegetables, beans, nuts, seeds, and whole grains.  Limit the amount of red meat and sweets that you eat.  Talk with your health care provider about whether it is safe for you to drink red wine in moderation. This means 1 glass a day for nonpregnant women and 2 glasses a day for men. A glass of wine equals 5 oz (150 mL). This information is not intended to replace advice given to you by your health care provider. Make sure you discuss any questions you have with your health care provider. Document Revised: 10/09/2015 Document Reviewed: 10/02/2015 Elsevier Patient Education  [place].

## 2019-06-01 ENCOUNTER — Encounter: Payer: Medicare Other | Admitting: Counselor

## 2019-06-11 ENCOUNTER — Encounter: Payer: Medicare Other | Admitting: Counselor

## 2019-06-18 ENCOUNTER — Encounter: Payer: Medicare Other | Admitting: Counselor

## 2019-09-03 ENCOUNTER — Encounter: Payer: Medicare Other | Admitting: Counselor

## 2019-09-10 ENCOUNTER — Encounter: Payer: Medicare Other | Admitting: Counselor

## 2019-09-10 ENCOUNTER — Other Ambulatory Visit: Payer: Self-pay
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
## 2019-11-19 ENCOUNTER — Ambulatory Visit: Payer: Medicare Other | Admitting: Neurology

## 2019-11-19 ENCOUNTER — Encounter: Payer: Medicare Other | Admitting: Counselor

## 2019-11-19 NOTE — Progress Notes (Deleted)
[facility]  Patient Name: Norvil Martensen MRN: [id number] Date of Birth: 1945-07-18 Age: 74 y.o. Education: ***  Referral Circumstances and Background Information  Mr. [place] Dube is a 74 y.o., {pshandedness:23626} dominant, *** *** with a history of HTN, CKD, and right basal ganglia/internal capsular infarct on 10/20/2019 presenting with elft-sided facial droop and left sided numbness.   On interview, ***  Past Medical History and Review of Relevant Studies   Patient Active Problem List   Diagnosis Date Noted  . Acute CVA (cerebrovascular accident) ([place]) 10/21/2018  . Hypertensive urgency 10/21/2018    Review of Neuroimaging and Relevant Medical History: ***  Current Outpatient Medications  Medication Sig Dispense Refill  . amLODipine (NORVASC) 2.5 MG tablet Take 1 tablet (2.5 mg total) by mouth daily at 6 PM. 30 tablet 3  . atorvastatin (LIPITOR) 40 MG tablet Take 1 tablet (40 mg total) by mouth daily at 6 PM. 30 tablet 3  . carvedilol (COREG) 12.5 MG tablet Take 1 tablet (12.5 mg total) by mouth 2 (two) times daily with a meal. 60 tablet 3  . colchicine 0.6 MG tablet Take 0.6 mg by mouth daily as needed (gout).    Marland Kitchen escitalopram (LEXAPRO) 10 MG tablet Take 1 tablet (10 mg total) by mouth daily. 30 tablet 5   No current facility-administered medications for this visit.    Family History  Family history unknown: Yes    There {psisisno:23650} family history of dementia. There {psisisno:23650} family history of psychiatric illness.  Psychosocial History  Developmental, Educational and Employment History: ***  Psychiatric History: ***  Substance Use History: ***  Relationship History and Living Cimcumstances: ***  Mental Status and Behavioral Observations  Sensorium/Arousal: The patient's level of arousal was awake and alert. Hearing and vision were *** for testing purposes. Orientation: *** Appearance: *** Behavior:  *** Speech/language: *** Gait/Posture: *** Movement: *** Social Comportment: *** Mood: *** Affect: *** Thought process/content: *** Safety: *** Insight: ***  ***  Test Procedures  Wide Range Achievement Test - 4             Word Reading Neuropsychological Assessment Battery  List Learning  Story Learning  Daily Living Memory  Naming  Digit Span Repeatable Battery for the Assessment of Neuropsychological Status (Form A)  Figure Copy  Judgment of Line Orientation  Coding  Figure Recall The Dot Counting Test A Random Letter Test Controlled Oral Word Association (F-A-S) Semantic Fluency (Animals) Trail Making Test A & B Complex Ideational Material Modified [place] Card Sorting Test Geriatric Depression Scale - Short Form Quick Dementia Rating System (completed by ***)  Plan  Belen Zwahlen was seen for a psychiatric diagnostic evaluation and neuropsychological testing. Full and complete note with impressions, recommendations, and interpretation of test data to follow.   Viviano Simas Nicole Kindred, PsyD, [place] Clinical Neuropsychologist  Informed Consent and Coding/Compliance  Risks and benefits of the evaluation were discussed with the patient prior to all testing procedures. I conducted a clinical interview {psqhptesting:23835} with Jarome Matin and {pstechnician:23646} administered additional test procedures. The patient was able to tolerate the testing procedures and the patient (and/or family if applicable) is likely to benefit from further follow up to receive the diagnosis and treatment recommendations, which will be rendered at the next encounter. Billing below reflects technician time, my direct face-to-face time with the patient, time spent in test administration, and time spent in professional activities including but not limited to: neuropsychological test interpretation, integration of neuropsychological test data  with clinical history, report preparation, treatment planning,  care coordination, and review of diagnostically pertinent medical history or studies.   Services associated with this encounter: Clinical Interview [phone]) plus *** minutes (33295; Neuropsychological Evaluation by Professional)  *** minutes (18841; Neuropsychological Evaluation by Professional, Adl.) *** minutes (66063; Test Administration by Professional) *** minutes (01601; Neuropsychological Testing by Technician) *** minutes (09323; Neuropsychological Testing by Technician, Adl.)

## 2019-11-26 ENCOUNTER — Other Ambulatory Visit: Payer: Self-pay

## 2019-11-26 ENCOUNTER — Encounter: Payer: Medicare Other | Admitting: Counselor

## 2020-04-28 ENCOUNTER — Other Ambulatory Visit: Payer: Self-pay | Admitting: Family Medicine

## 2020-04-28 DIAGNOSIS — R55 Syncope and collapse: Secondary | ICD-10-CM

## 2020-05-07 ENCOUNTER — Other Ambulatory Visit: Payer: Self-pay | Admitting: Family Medicine

## 2020-05-07 DIAGNOSIS — R55 Syncope and collapse: Secondary | ICD-10-CM

## 2020-05-26 ENCOUNTER — Ambulatory Visit
Admission: RE | Admit: 2020-05-26 | Discharge: 2020-05-26 | Disposition: A | Discharge status: Home / Self Care | Payer: Medicare Other | Source: Ambulatory Visit | Attending: Family Medicine | Admitting: Family Medicine

## 2020-05-26 ENCOUNTER — Other Ambulatory Visit: Payer: Self-pay

## 2020-05-26 DIAGNOSIS — R55 Syncope and collapse: Secondary | ICD-10-CM

## 2020-11-26 DIAGNOSIS — G3184 Mild cognitive impairment, so stated: Secondary | ICD-10-CM | POA: Diagnosis not present

## 2020-11-26 DIAGNOSIS — I6930 Unspecified sequelae of cerebral infarction: Secondary | ICD-10-CM | POA: Diagnosis not present

## 2020-11-26 DIAGNOSIS — E785 Hyperlipidemia, unspecified: Secondary | ICD-10-CM | POA: Diagnosis not present

## 2021-01-08 ENCOUNTER — Ambulatory Visit: Payer: Medicare Other | Attending: Internal Medicine

## 2021-01-08 ENCOUNTER — Other Ambulatory Visit (HOSPITAL_BASED_OUTPATIENT_CLINIC_OR_DEPARTMENT_OTHER): Payer: Self-pay

## 2021-01-08 DIAGNOSIS — Z23 Encounter for immunization: Secondary | ICD-10-CM

## 2021-01-08 MED ORDER — PFIZER COVID-19 VAC BIVALENT 30 MCG/0.3ML IM SUSP
INTRAMUSCULAR | 0 refills | Status: DC
  Filled 2021-01-08: qty 0.3, 1d supply, fill #0

## 2021-01-08 NOTE — Progress Notes (Signed)
   Covid-19 Vaccination Clinic  Name:  John Joseph    MRN: [id number] DOB: [birth year]  01/08/2021  [name] was observed post Covid-19 immunization for 15 minutes without incident. He was provided with Vaccine Information Sheet and instruction to access the V-Safe system.   [name] was instructed to call 911 with any severe reactions post vaccine: Difficulty breathing  Swelling of face and throat  A fast heartbeat  A bad rash all over body  Dizziness and weakness   Immunizations Administered     Name Date Dose VIS Date Route   Pfizer Covid-19 Vaccine Bivalent Booster 01/08/2021 11:28 AM 0.3 mL 10/22/2020 Intramuscular   Manufacturer: [employer]   Lot: [id number]   [place]: [phone]

## 2021-02-21 IMAGING — MR MRA HEAD WITHOUT CONTRAST
1 series · 22 of 48 positions shown · non-contrast
Comparison: MRI same day

CLINICAL DATA: Acute infarction right basal ganglia.  Follow-up.

EXAM:
MRA HEAD WITHOUT CONTRAST
TECHNIQUE: Angiographic images of the Circle of Willis were obtained using MRA
technique without intravenous contrast.

[Series 22: 3d cow · axial · 0.5mm · 0.41mm/px · z∈[-63,+52]mm · 22 of 244 slices shown]
[im 1/244]
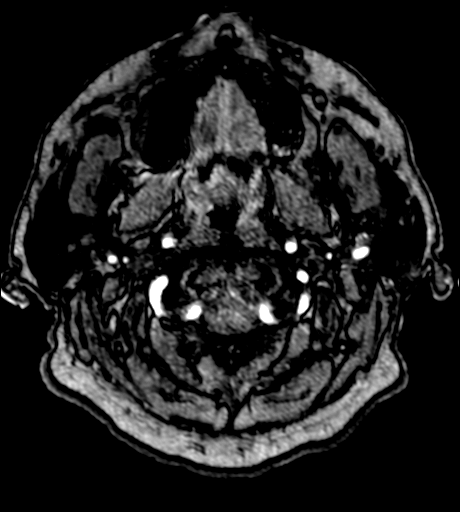
[im 6/244]
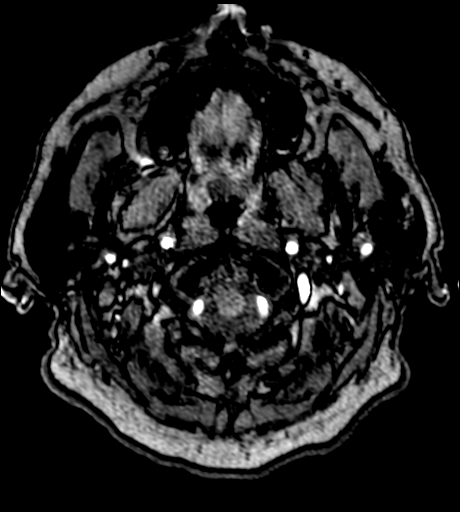
[im 11/244]
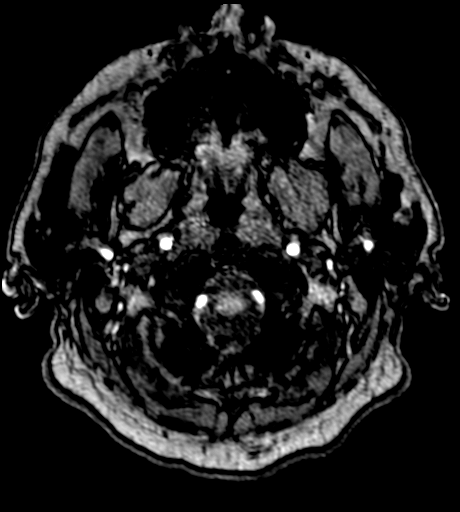
[im 16/244]
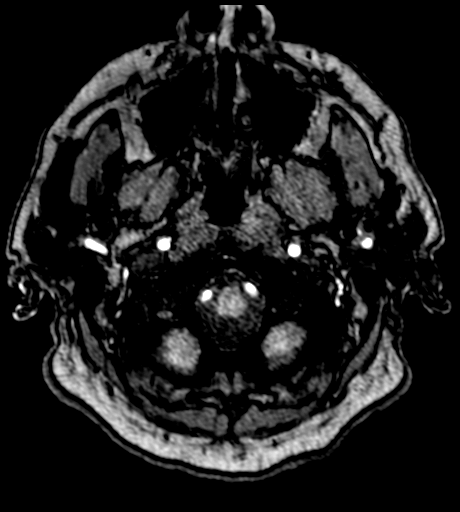
[im 21/244]
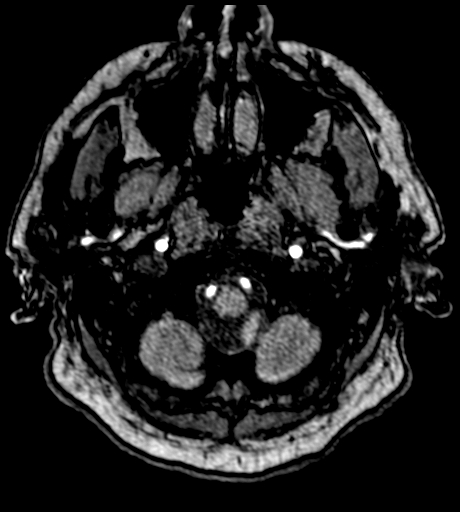
[im 26/244]
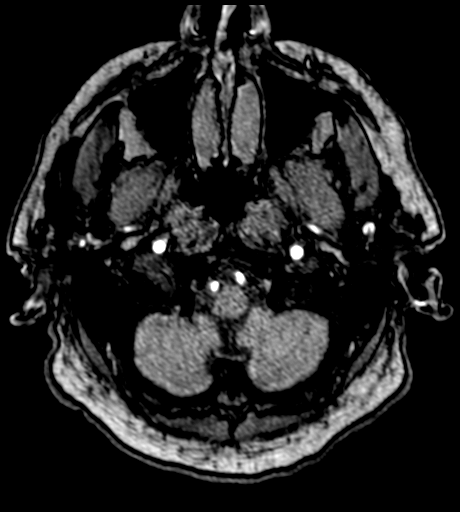
[im 32/244]
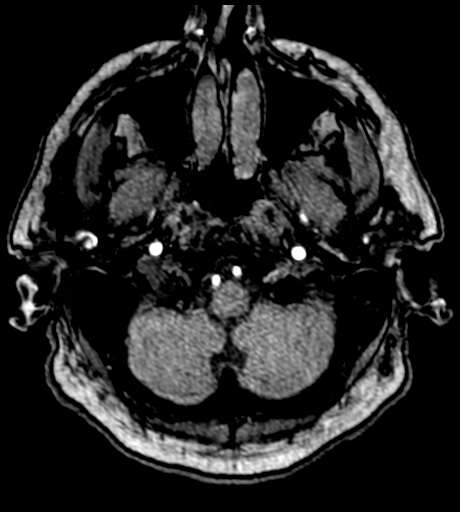
[im 37/244]
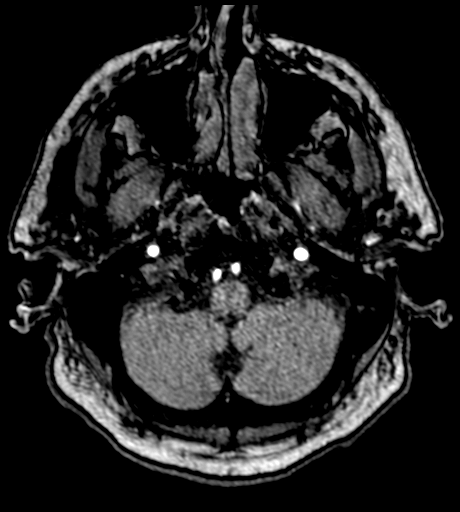
[im 42/244]
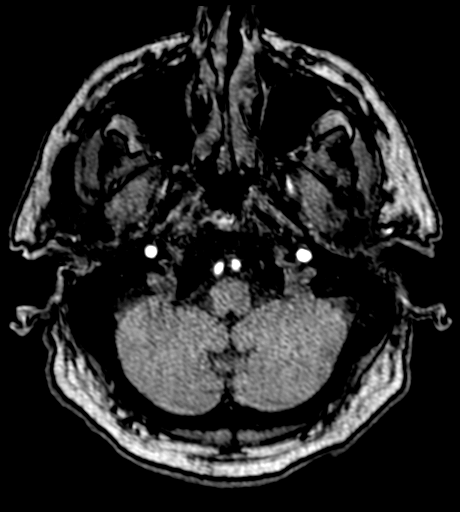
[im 47/244]
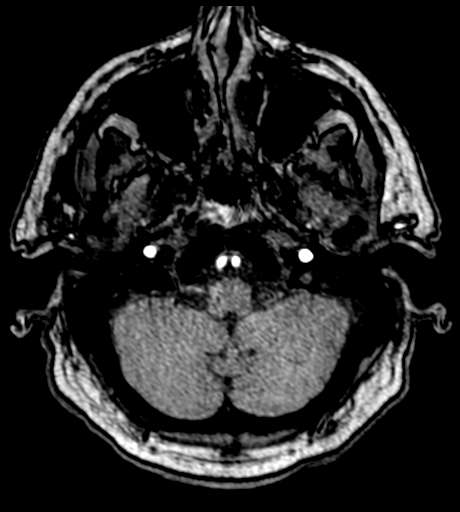
[im 52/244]
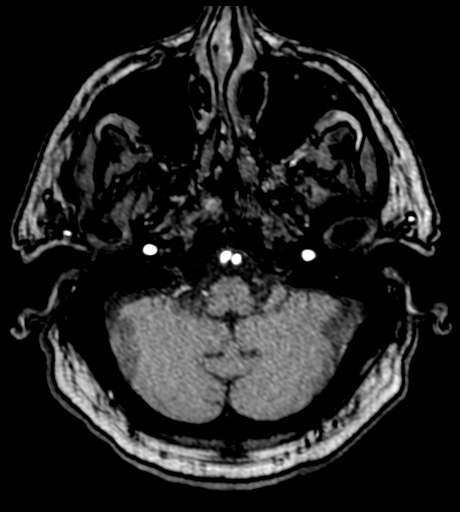
[im 57/244]
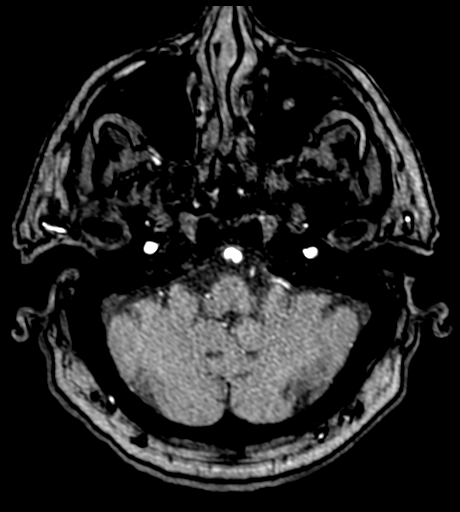
[im 63/244]
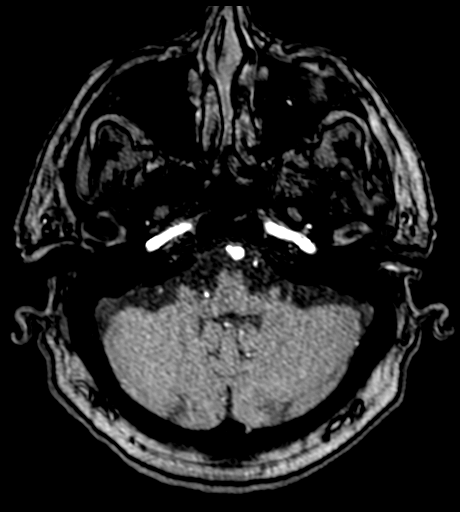
[im 68/244]
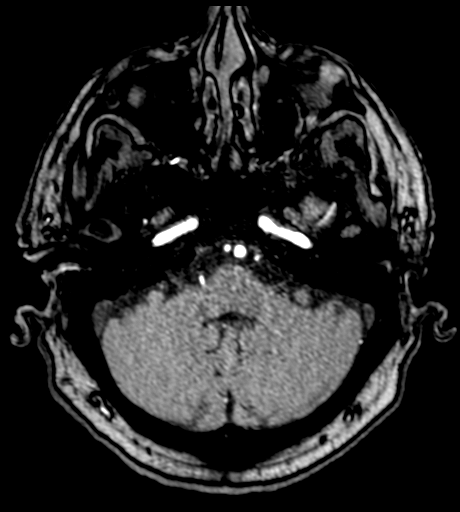
[im 78/244]
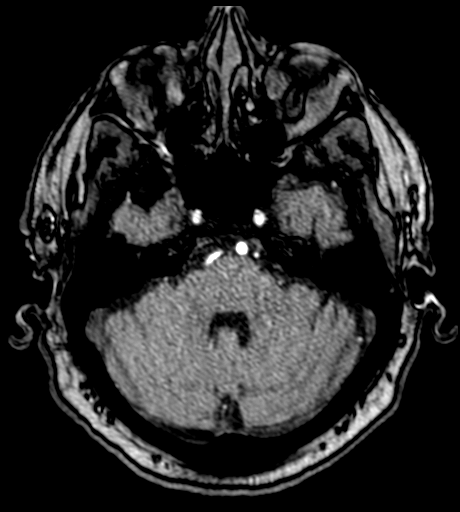
[im 109/244]
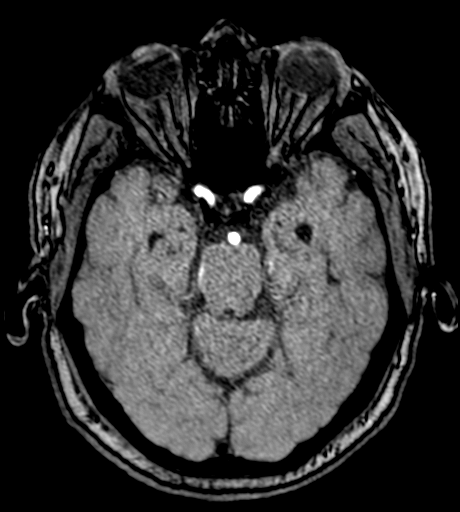
[im 125/244]
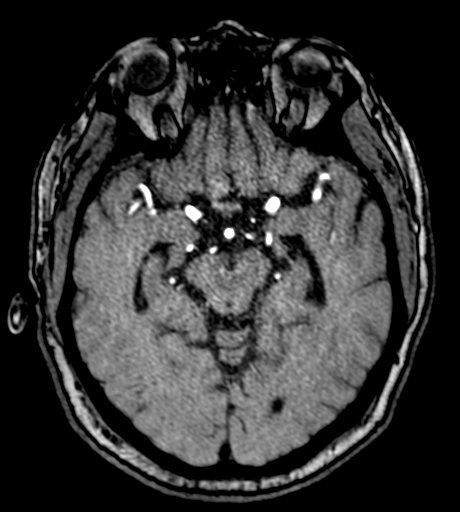
[im 140/244]
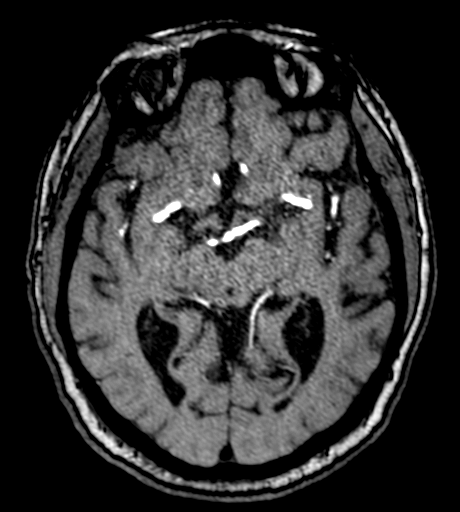
[im 171/244]
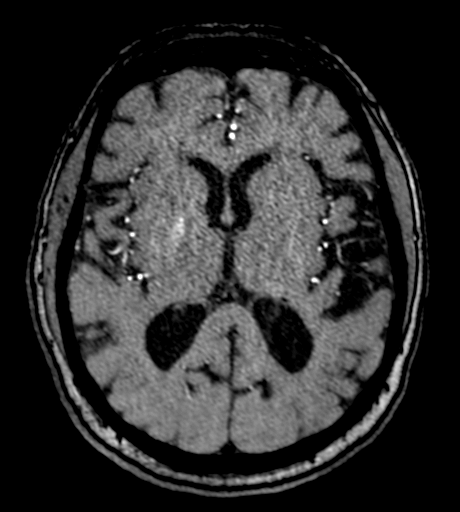
[im 202/244]
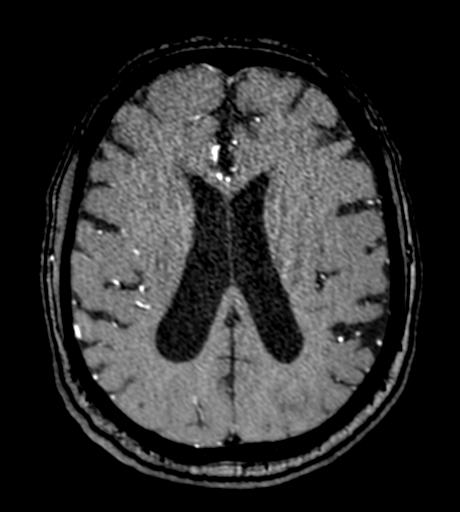
[im 207/244]
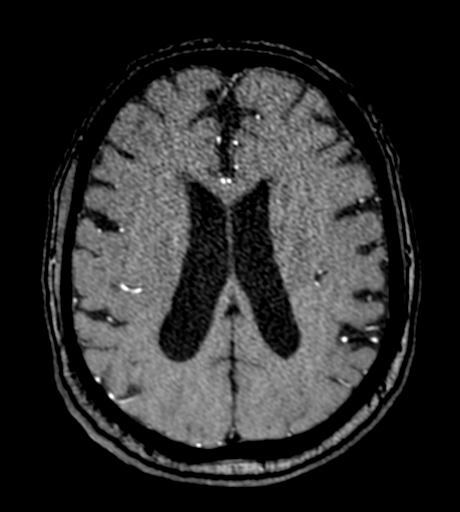
[im 233/244]
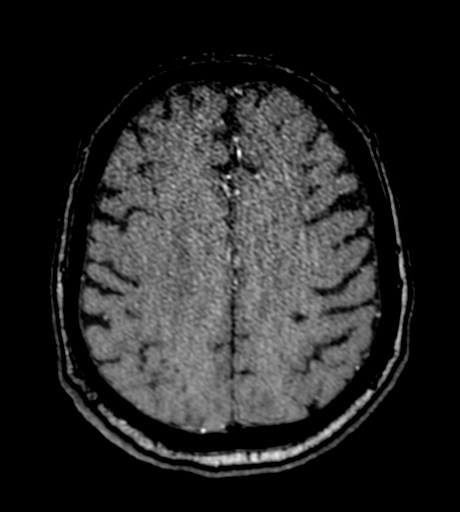

[22 of 48 positions shown; findings below may reference images not displayed]

FINDINGS: Both internal carotid arteries are widely patent into the brain. No
siphon stenosis. The anterior and middle cerebral vessels are patent
without proximal stenosis, aneurysm or vascular malformation. Distal
vessels do show some atherosclerotic irregularity.

Both vertebral arteries are widely patent to the basilar. No basilar
stenosis. Posterior circulation branch vessels appear normal.
IMPRESSION: Normal examination of the intracranial large and medium sized
vessels. Mild atherosclerotic irregularity of the distal branch
vessels.

## 2021-02-26 ENCOUNTER — Other Ambulatory Visit (HOSPITAL_BASED_OUTPATIENT_CLINIC_OR_DEPARTMENT_OTHER): Payer: Self-pay | Admitting: Family Medicine

## 2021-02-26 DIAGNOSIS — R109 Unspecified abdominal pain: Secondary | ICD-10-CM | POA: Diagnosis not present

## 2021-05-02 ENCOUNTER — Other Ambulatory Visit (HOSPITAL_BASED_OUTPATIENT_CLINIC_OR_DEPARTMENT_OTHER): Payer: Medicare Other

## 2021-08-17 DIAGNOSIS — D692 Other nonthrombocytopenic purpura: Secondary | ICD-10-CM | POA: Diagnosis not present

## 2021-08-17 DIAGNOSIS — Z0000 Encounter for general adult medical examination without abnormal findings: Secondary | ICD-10-CM | POA: Diagnosis not present

## 2021-08-17 DIAGNOSIS — N1831 Chronic kidney disease, stage 3a: Secondary | ICD-10-CM | POA: Diagnosis not present

## 2021-08-17 DIAGNOSIS — M109 Gout, unspecified: Secondary | ICD-10-CM | POA: Diagnosis not present

## 2021-08-17 DIAGNOSIS — Q6100 Congenital renal cyst, unspecified: Secondary | ICD-10-CM | POA: Diagnosis not present

## 2021-08-17 DIAGNOSIS — K824 Cholesterolosis of gallbladder: Secondary | ICD-10-CM | POA: Diagnosis not present

## 2021-08-17 DIAGNOSIS — I779 Disorder of arteries and arterioles, unspecified: Secondary | ICD-10-CM | POA: Diagnosis not present

## 2021-08-17 DIAGNOSIS — I10 Essential (primary) hypertension: Secondary | ICD-10-CM | POA: Diagnosis not present

## 2021-08-17 DIAGNOSIS — I679 Cerebrovascular disease, unspecified: Secondary | ICD-10-CM | POA: Diagnosis not present

## 2021-08-17 DIAGNOSIS — Z79899 Other long term (current) drug therapy: Secondary | ICD-10-CM | POA: Diagnosis not present

## 2021-11-12 DIAGNOSIS — M545 Low back pain, unspecified: Secondary | ICD-10-CM | POA: Diagnosis not present

## 2021-11-13 ENCOUNTER — Emergency Department (HOSPITAL_COMMUNITY): Payer: Medicare Other

## 2021-11-13 ENCOUNTER — Emergency Department (HOSPITAL_COMMUNITY)
Admission: EM | Admit: 2021-11-13 | Discharge: 2021-11-13 | Disposition: A | Discharge status: Home / Self Care | Payer: Medicare Other | Attending: Emergency Medicine | Admitting: Emergency Medicine

## 2021-11-13 ENCOUNTER — Encounter (HOSPITAL_COMMUNITY): Payer: Self-pay

## 2021-11-13 ENCOUNTER — Other Ambulatory Visit: Payer: Self-pay

## 2021-11-13 DIAGNOSIS — Z79899 Other long term (current) drug therapy: Secondary | ICD-10-CM | POA: Insufficient documentation

## 2021-11-13 DIAGNOSIS — R1031 Right lower quadrant pain: Secondary | ICD-10-CM | POA: Diagnosis present

## 2021-11-13 DIAGNOSIS — R1032 Left lower quadrant pain: Secondary | ICD-10-CM | POA: Diagnosis not present

## 2021-11-13 DIAGNOSIS — R109 Unspecified abdominal pain: Secondary | ICD-10-CM

## 2021-11-13 DIAGNOSIS — N200 Calculus of kidney: Secondary | ICD-10-CM | POA: Diagnosis not present

## 2021-11-13 LAB — CBC WITH DIFFERENTIAL/PLATELET
Abs Immature Granulocytes: 0.05 10*3/uL (ref 0.00–0.07)
Basophils Absolute: 0.1 10*3/uL (ref 0.0–0.1)
Basophils Relative: 1 %
Eosinophils Absolute: 0.3 10*3/uL (ref 0.0–0.5)
Eosinophils Relative: 4 %
HCT: 43.4 % (ref 39.0–52.0)
Hemoglobin: 15.1 g/dL (ref 13.0–17.0)
Immature Granulocytes: 1 %
Lymphocytes Relative: 18 %
Lymphs Abs: 1.4 10*3/uL (ref 0.7–4.0)
MCH: 30.8 pg (ref 26.0–34.0)
MCHC: 34.8 g/dL (ref 30.0–36.0)
MCV: 88.6 fL (ref 80.0–100.0)
Monocytes Absolute: 1 10*3/uL (ref 0.1–1.0)
Monocytes Relative: 13 %
Neutro Abs: 4.9 10*3/uL (ref 1.7–7.7)
Neutrophils Relative %: 63 %
Platelets: 283 10*3/uL (ref 150–400)
RBC: 4.9 MIL/uL (ref 4.22–5.81)
RDW: 13.2 % (ref 11.5–15.5)
WBC: 7.8 10*3/uL (ref 4.0–10.5)
nRBC: 0 % (ref 0.0–0.2)

## 2021-11-13 LAB — COMPREHENSIVE METABOLIC PANEL
ALT: 22 U/L (ref 0–44)
AST: 21 U/L (ref 15–41)
Albumin: 4 g/dL (ref 3.5–5.0)
Alkaline Phosphatase: 100 U/L (ref 38–126)
Anion gap: 6 (ref 5–15)
BUN: 18 mg/dL (ref 8–23)
CO2: 23 mmol/L (ref 22–32)
Calcium: 9 mg/dL (ref 8.9–10.3)
Chloride: 106 mmol/L (ref 98–111)
Creatinine, Ser: 1.38 mg/dL — ABNORMAL HIGH (ref 0.61–1.24)
GFR, Estimated: 53 mL/min — ABNORMAL LOW (ref >60)
Glucose, Bld: 92 mg/dL (ref 70–99)
Potassium: 4.3 mmol/L (ref 3.5–5.1)
Sodium: 135 mmol/L (ref 135–145)
Total Bilirubin: 1.2 mg/dL (ref 0.3–1.2)
Total Protein: 7.2 g/dL (ref 6.5–8.1)

## 2021-11-13 LAB — URINALYSIS, ROUTINE W REFLEX MICROSCOPIC
Bacteria, UA: NONE SEEN
Bilirubin Urine: NEGATIVE
Glucose, UA: NEGATIVE mg/dL
Hgb urine dipstick: NEGATIVE
Ketones, ur: NEGATIVE mg/dL
Leukocytes,Ua: NEGATIVE
Nitrite: NEGATIVE
Protein, ur: 100 mg/dL — AB
Specific Gravity, Urine: 1.016 (ref 1.005–1.030)
pH: 5 (ref 5.0–8.0)

## 2021-11-13 LAB — LIPASE, BLOOD: Lipase: 39 U/L (ref 11–51)

## 2021-11-13 NOTE — ED Provider Notes (Signed)
[facility] EMERGENCY DEPARTMENT Provider Note   CSN: [id number] Arrival date & time: 11/13/21  1243     History  Chief Complaint  Patient presents with   Back Pain   Flank Pain    John Joseph is a 76 y.o. male. Presenting with bilateral flank pain back pain and lower abdominal pain that has been intermittent over the past couple of years but has worsened. Over the past couple weeks he has had 2 "attacks" where his pain becomes so severe that he feels lightheaded and not able to move.  This occurred earlier this week.  He took aspirin and hot tea and his symptoms much improved. He also reports a bulge/swelling to his right lateral abdomen that has been present for approximately 4 years after having a surgery to evaluate a mass on his kidney.  He was told it was not malignant. Denies fever, urinary symptoms, constipation, or diarrhea.  Back Pain Associated symptoms: abdominal pain   Associated symptoms: no chest pain, no dysuria and no fever   Flank Pain Associated symptoms include abdominal pain. Pertinent negatives include no chest pain and no shortness of breath.       Home Medications Prior to Admission medications   Medication Sig Start Date End Date Taking? Authorizing Provider  amLODipine (NORVASC) 2.5 MG tablet Take 1 tablet (2.5 mg total) by mouth daily at 6 PM. 10/22/18   Danford, Suann Larry, MD  atorvastatin (LIPITOR) 40 MG tablet Take 1 tablet (40 mg total) by mouth daily at 6 PM. 10/22/18   Danford, Suann Larry, MD  carvedilol (COREG) 12.5 MG tablet Take 1 tablet (12.5 mg total) by mouth 2 (two) times daily with a meal. 10/22/18   Danford, Suann Larry, MD  colchicine 0.6 MG tablet Take 0.6 mg by mouth daily as needed (gout).    [provider]  COVID-19 mRNA bivalent vaccine, Pfizer, (PFIZER COVID-19 VAC BIVALENT) injection Inject into the muscle. 01/08/21   Carlyle Basques, MD  escitalopram (LEXAPRO) 10 MG tablet Take 1 tablet (10 mg  total) by mouth daily. 05/17/19   Pieter Partridge, DO      Allergies    Prednisone and Keflex [cephalexin]    Review of Systems   Review of Systems  Constitutional:  Negative for chills and fever.  HENT:  Negative for ear pain and sore throat.   Eyes:  Negative for pain and visual disturbance.  Respiratory:  Negative for cough and shortness of breath.   Cardiovascular:  Negative for chest pain and palpitations.  Gastrointestinal:  Positive for abdominal pain. Negative for vomiting.  Genitourinary:  Positive for flank pain. Negative for dysuria and hematuria.  Musculoskeletal:  Positive for back pain. Negative for arthralgias.  Skin:  Negative for color change and rash.  Neurological:  Negative for seizures and syncope.  All other systems reviewed and are negative.   Physical Exam Updated Vital Signs BP (!) 140/82   Pulse 68   Temp 98 F (36.7 C)   Resp 18   Ht '5\' 6"'$  (1.676 m)   Wt 87.1 kg   SpO2 98%   BMI 30.99 kg/m  Physical Exam Vitals and nursing note reviewed.  Constitutional:      General: He is not in acute distress.    Appearance: He is well-developed.  HENT:     Head: Normocephalic and atraumatic.  Eyes:     Conjunctiva/sclera: Conjunctivae normal.  Cardiovascular:     Rate and Rhythm: Normal rate and regular  rhythm.     Heart sounds: No murmur heard. Pulmonary:     Effort: Pulmonary effort is normal. No respiratory distress.     Breath sounds: Normal breath sounds.  Abdominal:     Palpations: Abdomen is soft.     Tenderness: There is abdominal tenderness (lower abdominal).     Comments: Right lateral abdomen: Remote appearing surgical incision, with mild bulging area, no overlying skin changes  Musculoskeletal:        General: No swelling.     Cervical back: Neck supple.  Skin:    General: Skin is warm and dry.     Capillary Refill: Capillary refill takes less than 2 seconds.  Neurological:     Mental Status: He is alert.  Psychiatric:        Mood  and Affect: Mood normal.     ED Results / Procedures / Treatments   Labs (all labs ordered are listed, but only abnormal results are displayed) Labs Reviewed  COMPREHENSIVE METABOLIC PANEL - Abnormal; Notable for the following components:      Result Value   Creatinine, Ser 1.38 (*)    GFR, Estimated 53 (*)    All other components within normal limits  URINALYSIS, ROUTINE W REFLEX MICROSCOPIC - Abnormal; Notable for the following components:   APPearance HAZY (*)    Protein, ur 100 (*)    All other components within normal limits  CBC WITH DIFFERENTIAL/PLATELET  LIPASE, BLOOD    EKG None  Radiology CT ABDOMEN PELVIS WO CONTRAST  Result Date: 11/13/2021 CLINICAL DATA:  Left lower quadrant pain.  Left flank pain. EXAM: CT ABDOMEN AND PELVIS WITHOUT CONTRAST TECHNIQUE: Multidetector CT imaging of the abdomen and pelvis was performed following the standard protocol without IV contrast. RADIATION DOSE REDUCTION: This exam was performed according to the departmental dose-optimization program which includes automated exposure control, adjustment of the mA and/or kV according to patient size and/or use of iterative reconstruction technique. COMPARISON:  None Available. FINDINGS: Lower chest: No acute findings. Hepatobiliary: No mass visualized on this unenhanced exam. A 1 cm calcified gallstone is seen, there is no evidence of cholecystitis or biliary ductal dilatation. Pancreas: No mass or inflammatory process visualized on this unenhanced exam. Spleen:  Within normal limits in size. Adrenals/Urinary tract: Numerous tiny less than 5 mm renal calculi are seen bilaterally. No evidence of ureteral calculi or hydronephrosis. Several subcapsular left renal lesions are seen, largest in the lower pole measuring 2.3 cm and showing intermediate attenuation. This cannot be characterized on this unenhanced exam, and could represent a hemorrhagic cyst or solid mass. Subcapsular calcification is also seen in  the lower pole the right kidney which may be due to old hematoma or postsurgical change. Unremarkable unopacified urinary bladder. Stomach/Bowel: No evidence of obstruction, inflammatory process, or abnormal fluid collections. Normal appendix visualized. Diverticulosis is seen mainly involving the descending and sigmoid colon, however there is no evidence of diverticulitis. Vascular/Lymphatic: No pathologically enlarged lymph nodes identified. No evidence of abdominal aortic aneurysm. Aortic atherosclerotic calcification incidentally noted. Reproductive:  Moderately enlarged prostate. Other:  None. Musculoskeletal: No suspicious bone lesions identified. Subacute or old fracture of the right posterior 11th rib is noted. IMPRESSION: Bilateral nephrolithiasis. No evidence of ureteral calculi, hydronephrosis, or other acute findings. Colonic diverticulosis, without radiographic evidence of diverticulitis. Cholelithiasis. No radiographic evidence of cholecystitis. Moderately enlarged prostate. Indeterminate left renal lesions, which could represent hemorrhagic cyst or solid mass. Nonemergent outpatient abdomen MRI without and with contrast is recommended for further  characterization. Subacute versus chronic fracture of the right posterior 11th rib. Electronically Signed   By: Marlaine Hind M.D.   On: 11/13/2021 18:47    Procedures Procedures    Medications Ordered in ED Medications - No data to display  ED Course/ Medical Decision Making/ A&P                           Medical Decision Making Amount and/or Complexity of Data Reviewed Radiology: ordered.   76 year old male with past medical history of benign right-sided kidney mass removed 4 years ago and hypertension presenting with concern for intermittent and worsening bilateral lower back/flank pain and lower abdominal pain.  Patient's is not in significant discomfort at this time, he is mostly concerned about the previous episodes of pain.    Differential diagnosis includes AKI, electrolyte maladies, UTI, nephrolithiasis, musculoskeletal pain, hernia, constipation, diverticulitis.  Labs reviewed and overall reassuring.  Creatinine 1.38, similar to baseline.  Urinalysis not consistent with infection.  Lipase within normal limits at 39.  No leukocytosis or anemia.  CT abdomen pelvis showed moderate bilateral nephrolithiasis less than 5 mm.  Additionally there were several left renal lesions seen.  Remarkable pancreas.  Overall, patient's recent intermittent flank pain likely due to passing kidney stones.  Admitted close follow-up with urology for further discussion and work-up for the renal lesions. Discussed the above results with the patient and all questions answered.  Information given for urology follow-up.  Strict return precautions given.  Patient felt comfortable discharge at this time.        Final Clinical Impression(s) / ED Diagnoses Final diagnoses:  Flank pain    Rx / DC Orders ED Discharge Orders     None         Rosine Abe, MD 11/13/21 9211    Fredia Sorrow, MD 11/20/21 [phone]

## 2021-11-13 NOTE — Discharge Instructions (Addendum)
I provided information below for you to schedule urology follow-up.  Please discuss your kidney lesions and kidney stones.  Return to the ED as needed.  Follow-up with your primary doctor as well.

## 2021-11-13 NOTE — ED Triage Notes (Signed)
Pt arrived POV from home c/o back pain from his shoulders down his back to his hips. Pt also states he had nausea and dizziness as well. This was a week ago but his dr asked him to come get checked out.

## 2021-11-13 NOTE — ED Notes (Signed)
Discharge instructions reviewed with patient. Patient verbalized understanding of instructions. Follow-up care and medications were reviewed. Patient ambulatory with steady gait. VSS upon discharge.  ?

## 2021-11-13 NOTE — ED Provider Triage Note (Signed)
Emergency Medicine Provider Triage Evaluation Note  John Joseph , a 76 y.o. male  was evaluated in triage.  Pt complains of concerns for right flank pain.  Notes that he has had "attacks" of pain radiating from his shoulder to his kidneys for the past 2 weeks.  Denies recent injury, trauma, fall, heavy lifting.  Denies shortness of breath, chest pain, vomiting, bowel/bladder incontinence.  Has associated right-sided abdominal pain as well as nausea.  Review of Systems  Positive:  Negative:   Physical Exam  BP (!) 145/89   Pulse 66   Temp 98.6 F (37 C) (Oral)   Resp 18   Ht '5\' 6"'$  (1.676 m)   Wt 87.1 kg   SpO2 97%   BMI 30.99 kg/m  Gen:   Awake, no distress   Resp:  Normal effort  MSK:   Moves extremities without difficulty  Other:  Right CVA tenderness to palpation.  Medical Decision Making  Medically screening exam initiated at 1:31 PM.  Appropriate orders placed.  John Joseph was informed that the remainder of the evaluation will be completed by another provider, this initial triage assessment does not replace that evaluation, and the importance of remaining in the ED until their evaluation is complete.  Work-up initiated   Torren Maffeo A, PA-C 11/13/21 1332

## 2021-11-23 DIAGNOSIS — N200 Calculus of kidney: Secondary | ICD-10-CM | POA: Diagnosis not present

## 2021-11-23 DIAGNOSIS — N289 Disorder of kidney and ureter, unspecified: Secondary | ICD-10-CM | POA: Diagnosis not present

## 2021-11-23 DIAGNOSIS — K8020 Calculus of gallbladder without cholecystitis without obstruction: Secondary | ICD-10-CM | POA: Diagnosis not present

## 2021-12-17 DIAGNOSIS — D4102 Neoplasm of uncertain behavior of left kidney: Secondary | ICD-10-CM | POA: Diagnosis not present

## 2021-12-17 DIAGNOSIS — N200 Calculus of kidney: Secondary | ICD-10-CM | POA: Diagnosis not present

## 2021-12-18 ENCOUNTER — Other Ambulatory Visit: Payer: Self-pay | Admitting: Urology

## 2021-12-18 DIAGNOSIS — D4102 Neoplasm of uncertain behavior of left kidney: Secondary | ICD-10-CM

## 2021-12-28 DIAGNOSIS — M109 Gout, unspecified: Secondary | ICD-10-CM | POA: Diagnosis not present

## 2022-01-22 ENCOUNTER — Other Ambulatory Visit (HOSPITAL_BASED_OUTPATIENT_CLINIC_OR_DEPARTMENT_OTHER): Payer: Self-pay

## 2022-01-22 MED ORDER — COVID-19 MRNA VAC-TRIS(PFIZER) 30 MCG/0.3ML IM SUSY
0.3000 mL | PREFILLED_SYRINGE | Freq: Once | INTRAMUSCULAR | 0 refills | Status: AC
  Filled 2022-01-22: qty 0.3, 1d supply, fill #0

## 2022-03-01 ENCOUNTER — Other Ambulatory Visit (HOSPITAL_BASED_OUTPATIENT_CLINIC_OR_DEPARTMENT_OTHER): Payer: Self-pay

## 2022-09-08 DIAGNOSIS — I700 Atherosclerosis of aorta: Secondary | ICD-10-CM | POA: Diagnosis not present

## 2022-09-08 DIAGNOSIS — I6523 Occlusion and stenosis of bilateral carotid arteries: Secondary | ICD-10-CM | POA: Diagnosis not present

## 2022-09-08 DIAGNOSIS — I779 Disorder of arteries and arterioles, unspecified: Secondary | ICD-10-CM | POA: Diagnosis not present

## 2022-09-08 DIAGNOSIS — Z0000 Encounter for general adult medical examination without abnormal findings: Secondary | ICD-10-CM | POA: Diagnosis not present

## 2022-09-08 DIAGNOSIS — N1831 Chronic kidney disease, stage 3a: Secondary | ICD-10-CM | POA: Diagnosis not present

## 2022-09-08 DIAGNOSIS — Z79899 Other long term (current) drug therapy: Secondary | ICD-10-CM | POA: Diagnosis not present

## 2023-01-19 DIAGNOSIS — Z7902 Long term (current) use of antithrombotics/antiplatelets: Secondary | ICD-10-CM | POA: Diagnosis not present

## 2023-01-19 DIAGNOSIS — E785 Hyperlipidemia, unspecified: Secondary | ICD-10-CM | POA: Diagnosis not present

## 2023-01-19 DIAGNOSIS — I10 Essential (primary) hypertension: Secondary | ICD-10-CM | POA: Diagnosis not present

## 2023-01-19 DIAGNOSIS — G3184 Mild cognitive impairment, so stated: Secondary | ICD-10-CM | POA: Diagnosis not present

## 2023-01-19 DIAGNOSIS — I69319 Unspecified symptoms and signs involving cognitive functions following cerebral infarction: Secondary | ICD-10-CM | POA: Diagnosis not present

## 2023-01-19 DIAGNOSIS — Z8673 Personal history of transient ischemic attack (TIA), and cerebral infarction without residual deficits: Secondary | ICD-10-CM | POA: Diagnosis not present

## 2023-01-19 DIAGNOSIS — M109 Gout, unspecified: Secondary | ICD-10-CM | POA: Diagnosis not present

## 2023-05-20 DIAGNOSIS — R413 Other amnesia: Secondary | ICD-10-CM | POA: Diagnosis not present

## 2023-05-20 DIAGNOSIS — I10 Essential (primary) hypertension: Secondary | ICD-10-CM | POA: Diagnosis not present

## 2023-07-07 ENCOUNTER — Inpatient Hospital Stay (HOSPITAL_COMMUNITY)
Admission: EM | Admit: 2023-07-07 | Discharge: 2023-07-10 | DRG: 659 | Disposition: A | Discharge status: Home / Self Care | Attending: Internal Medicine | Admitting: Internal Medicine

## 2023-07-07 ENCOUNTER — Other Ambulatory Visit: Payer: Self-pay

## 2023-07-07 ENCOUNTER — Encounter (HOSPITAL_COMMUNITY): Payer: Self-pay

## 2023-07-07 ENCOUNTER — Emergency Department (HOSPITAL_COMMUNITY)

## 2023-07-07 DIAGNOSIS — G9341 Metabolic encephalopathy: Secondary | ICD-10-CM | POA: Diagnosis present

## 2023-07-07 DIAGNOSIS — Z888 Allergy status to other drugs, medicaments and biological substances status: Secondary | ICD-10-CM | POA: Diagnosis not present

## 2023-07-07 DIAGNOSIS — R112 Nausea with vomiting, unspecified: Secondary | ICD-10-CM | POA: Diagnosis present

## 2023-07-07 DIAGNOSIS — N179 Acute kidney failure, unspecified: Principal | ICD-10-CM

## 2023-07-07 DIAGNOSIS — N202 Calculus of kidney with calculus of ureter: Secondary | ICD-10-CM | POA: Diagnosis not present

## 2023-07-07 DIAGNOSIS — Z881 Allergy status to other antibiotic agents status: Secondary | ICD-10-CM | POA: Diagnosis not present

## 2023-07-07 DIAGNOSIS — I10 Essential (primary) hypertension: Secondary | ICD-10-CM | POA: Diagnosis present

## 2023-07-07 DIAGNOSIS — N200 Calculus of kidney: Secondary | ICD-10-CM | POA: Diagnosis not present

## 2023-07-07 DIAGNOSIS — Z87442 Personal history of urinary calculi: Secondary | ICD-10-CM | POA: Diagnosis not present

## 2023-07-07 DIAGNOSIS — F32A Depression, unspecified: Secondary | ICD-10-CM | POA: Diagnosis not present

## 2023-07-07 DIAGNOSIS — Z7902 Long term (current) use of antithrombotics/antiplatelets: Secondary | ICD-10-CM | POA: Diagnosis not present

## 2023-07-07 DIAGNOSIS — R001 Bradycardia, unspecified: Secondary | ICD-10-CM | POA: Diagnosis present

## 2023-07-07 DIAGNOSIS — E66811 Obesity, class 1: Secondary | ICD-10-CM | POA: Diagnosis not present

## 2023-07-07 DIAGNOSIS — I7121 Aneurysm of the ascending aorta, without rupture: Secondary | ICD-10-CM | POA: Diagnosis not present

## 2023-07-07 DIAGNOSIS — F0390 Unspecified dementia without behavioral disturbance: Secondary | ICD-10-CM | POA: Diagnosis present

## 2023-07-07 DIAGNOSIS — Z905 Acquired absence of kidney: Secondary | ICD-10-CM | POA: Diagnosis not present

## 2023-07-07 DIAGNOSIS — Z8673 Personal history of transient ischemic attack (TIA), and cerebral infarction without residual deficits: Secondary | ICD-10-CM | POA: Diagnosis not present

## 2023-07-07 DIAGNOSIS — I129 Hypertensive chronic kidney disease with stage 1 through stage 4 chronic kidney disease, or unspecified chronic kidney disease: Secondary | ICD-10-CM | POA: Diagnosis present

## 2023-07-07 DIAGNOSIS — N132 Hydronephrosis with renal and ureteral calculous obstruction: Secondary | ICD-10-CM | POA: Diagnosis not present

## 2023-07-07 DIAGNOSIS — E7800 Pure hypercholesterolemia, unspecified: Secondary | ICD-10-CM | POA: Diagnosis present

## 2023-07-07 DIAGNOSIS — M109 Gout, unspecified: Secondary | ICD-10-CM | POA: Diagnosis present

## 2023-07-07 DIAGNOSIS — N201 Calculus of ureter: Secondary | ICD-10-CM

## 2023-07-07 DIAGNOSIS — I160 Hypertensive urgency: Secondary | ICD-10-CM | POA: Diagnosis present

## 2023-07-07 DIAGNOSIS — Z79899 Other long term (current) drug therapy: Secondary | ICD-10-CM | POA: Diagnosis not present

## 2023-07-07 DIAGNOSIS — N184 Chronic kidney disease, stage 4 (severe): Secondary | ICD-10-CM | POA: Diagnosis not present

## 2023-07-07 DIAGNOSIS — Z91041 Radiographic dye allergy status: Secondary | ICD-10-CM | POA: Diagnosis not present

## 2023-07-07 DIAGNOSIS — K8020 Calculus of gallbladder without cholecystitis without obstruction: Secondary | ICD-10-CM | POA: Diagnosis not present

## 2023-07-07 DIAGNOSIS — N289 Disorder of kidney and ureter, unspecified: Secondary | ICD-10-CM | POA: Diagnosis not present

## 2023-07-07 DIAGNOSIS — Z6830 Body mass index (BMI) 30.0-30.9, adult: Secondary | ICD-10-CM

## 2023-07-07 HISTORY — DX: Hyperlipidemia, unspecified: E78.5

## 2023-07-07 HISTORY — DX: Gout, unspecified: M10.9

## 2023-07-07 HISTORY — DX: Major depressive disorder, single episode, unspecified: F32.9

## 2023-07-07 HISTORY — DX: Disorder of kidney and ureter, unspecified: N28.9

## 2023-07-07 HISTORY — DX: Neoplasm of uncertain behavior of left kidney: D41.02

## 2023-07-07 HISTORY — DX: Cerebral infarction, unspecified: I63.9

## 2023-07-07 LAB — COMPREHENSIVE METABOLIC PANEL WITH GFR
ALT: 13 U/L (ref 0–44)
AST: 18 U/L (ref 15–41)
Albumin: 3.6 g/dL (ref 3.5–5.0)
Alkaline Phosphatase: 89 U/L (ref 38–126)
Anion gap: 10 (ref 5–15)
BUN: 22 mg/dL (ref 8–23)
CO2: 26 mmol/L (ref 22–32)
Calcium: 9 mg/dL (ref 8.9–10.3)
Chloride: 100 mmol/L (ref 98–111)
Creatinine, Ser: 2.22 mg/dL — ABNORMAL HIGH (ref 0.61–1.24)
GFR, Estimated: 29 mL/min — ABNORMAL LOW (ref >60)
Glucose, Bld: 108 mg/dL — ABNORMAL HIGH (ref 70–99)
Potassium: 4.2 mmol/L (ref 3.5–5.1)
Sodium: 136 mmol/L (ref 135–145)
Total Bilirubin: 1.1 mg/dL (ref 0.0–1.2)
Total Protein: 6.9 g/dL (ref 6.5–8.1)

## 2023-07-07 LAB — CBC WITH DIFFERENTIAL/PLATELET
Abs Immature Granulocytes: 0.07 10*3/uL (ref 0.00–0.07)
Basophils Absolute: 0.1 10*3/uL (ref 0.0–0.1)
Basophils Relative: 1 %
Eosinophils Absolute: 0.1 10*3/uL (ref 0.0–0.5)
Eosinophils Relative: 1 %
HCT: 43.7 % (ref 39.0–52.0)
Hemoglobin: 15.1 g/dL (ref 13.0–17.0)
Immature Granulocytes: 1 %
Lymphocytes Relative: 7 %
Lymphs Abs: 0.7 10*3/uL (ref 0.7–4.0)
MCH: 30.6 pg (ref 26.0–34.0)
MCHC: 34.6 g/dL (ref 30.0–36.0)
MCV: 88.5 fL (ref 80.0–100.0)
Monocytes Absolute: 1 10*3/uL (ref 0.1–1.0)
Monocytes Relative: 10 %
Neutro Abs: 8.6 10*3/uL — ABNORMAL HIGH (ref 1.7–7.7)
Neutrophils Relative %: 80 %
Platelets: 270 10*3/uL (ref 150–400)
RBC: 4.94 MIL/uL (ref 4.22–5.81)
RDW: 13.6 % (ref 11.5–15.5)
WBC: 10.6 10*3/uL — ABNORMAL HIGH (ref 4.0–10.5)
nRBC: 0 % (ref 0.0–0.2)

## 2023-07-07 LAB — I-STAT CHEM 8, ED
BUN: 21 mg/dL (ref 8–23)
Calcium, Ion: 1.2 mmol/L (ref 1.15–1.40)
Chloride: 101 mmol/L (ref 98–111)
Creatinine, Ser: 2.4 mg/dL — ABNORMAL HIGH (ref 0.61–1.24)
Glucose, Bld: 108 mg/dL — ABNORMAL HIGH (ref 70–99)
HCT: 44 % (ref 39.0–52.0)
Hemoglobin: 15 g/dL (ref 13.0–17.0)
Potassium: 4.3 mmol/L (ref 3.5–5.1)
Sodium: 138 mmol/L (ref 135–145)
TCO2: 24 mmol/L (ref 22–32)

## 2023-07-07 LAB — LIPASE, BLOOD: Lipase: 37 U/L (ref 11–51)

## 2023-07-07 LAB — URINALYSIS, ROUTINE W REFLEX MICROSCOPIC
Bacteria, UA: NONE SEEN
Bilirubin Urine: NEGATIVE
Glucose, UA: NEGATIVE mg/dL
Hgb urine dipstick: NEGATIVE
Ketones, ur: NEGATIVE mg/dL
Leukocytes,Ua: NEGATIVE
Nitrite: NEGATIVE
Protein, ur: 100 mg/dL — AB
Specific Gravity, Urine: 1.01 (ref 1.005–1.030)
pH: 7 (ref 5.0–8.0)

## 2023-07-07 MED ORDER — MORPHINE SULFATE (PF) 4 MG/ML IV SOLN
4.0000 mg | Freq: Once | INTRAVENOUS | Status: AC
  Administered 2023-07-07: 4 mg via INTRAVENOUS
  Filled 2023-07-07 (×2): qty 1

## 2023-07-07 MED ORDER — CARVEDILOL 12.5 MG PO TABS
12.5000 mg | ORAL_TABLET | Freq: Two times a day (BID) | ORAL | Status: DC
  Administered 2023-07-07: 12.5 mg via ORAL
  Filled 2023-07-07: qty 1

## 2023-07-07 MED ORDER — LABETALOL HCL 5 MG/ML IV SOLN
10.0000 mg | INTRAVENOUS | Status: DC | PRN
  Administered 2023-07-07: 10 mg via INTRAVENOUS
  Filled 2023-07-07: qty 4

## 2023-07-07 MED ORDER — ACETAMINOPHEN 500 MG PO TABS
1000.0000 mg | ORAL_TABLET | Freq: Once | ORAL | Status: DC
  Filled 2023-07-07 (×2): qty 2

## 2023-07-07 MED ORDER — ONDANSETRON HCL 4 MG/2ML IJ SOLN
4.0000 mg | Freq: Once | INTRAMUSCULAR | Status: AC
  Administered 2023-07-07: 4 mg via INTRAVENOUS
  Filled 2023-07-07 (×2): qty 2

## 2023-07-07 MED ORDER — LACTATED RINGERS IV BOLUS
1000.0000 mL | Freq: Once | INTRAVENOUS | Status: AC
  Administered 2023-07-07: 1000 mL via INTRAVENOUS

## 2023-07-07 NOTE — ED Notes (Signed)
 Pt was placed in vertical one and CT was going to get pt; they did not see pt nor was there a response when name was called

## 2023-07-07 NOTE — ED Triage Notes (Signed)
 Pt arrived via POV c/o bilateral flank pain X 3 days. Pt denies hematuria. Pt reports pain has become unbearable.

## 2023-07-07 NOTE — ED Notes (Signed)
 Pt refusing IV,IV fluid and medication. States that he doesn't want any "habit forming". Pt states his daughter is on the way and that he'd like to talk to a doctor about his "results", but pt has only had blood work done. Pt allowed CT tech to scan him at 5:59pm

## 2023-07-07 NOTE — ED Provider Notes (Addendum)
 [place] EMERGENCY DEPARTMENT AT [facility] Provider Note   CSN: [id number] Arrival date & time: 07/07/23  1318     History  Chief Complaint  Patient presents with   Flank Pain    John Joseph is a 78 y.o. male.  History of stroke and hypertension and high cholesterol.  Presents the ER for left flank pain.  Started suddenly last night.  He has had associated nausea but no vomiting, no urinary symptoms.  States it is in the left side, feels like he is "kicked in the back".  Feels similar to prior kidney stone, although that was 20 years ago.  She felt warm today but unsure of any fever.    Flank Pain       Home Medications Prior to Admission medications   Medication Sig Start Date End Date Taking? Authorizing Provider  amLODipine  (NORVASC ) 2.5 MG tablet Take 1 tablet (2.5 mg total) by mouth daily at 6 PM. 10/22/18   Danford, Willis Harter, MD  atorvastatin  (LIPITOR ) 40 MG tablet Take 1 tablet (40 mg total) by mouth daily at 6 PM. 10/22/18   Danford, Willis Harter, MD  carvedilol  (COREG ) 12.5 MG tablet Take 1 tablet (12.5 mg total) by mouth 2 (two) times daily with a meal. 10/22/18   Danford, Willis Harter, MD  colchicine 0.6 MG tablet Take 0.6 mg by mouth daily as needed (gout).    [provider]  COVID-19 mRNA bivalent vaccine, Pfizer, (PFIZER COVID-19 VAC BIVALENT) injection Inject into the muscle. 01/08/21   Liane Redman, MD  escitalopram  (LEXAPRO ) 10 MG tablet Take 1 tablet (10 mg total) by mouth daily. 05/17/19   Merriam Abbey, DO      Allergies    Prednisone and Keflex [cephalexin]    Review of Systems   Review of Systems  Genitourinary:  Positive for flank pain.    Physical Exam Updated Vital Signs Ht 5\' 6"  (1.676 m)   Wt 87.1 kg   BMI 30.99 kg/m  Physical Exam Vitals and nursing note reviewed.  Constitutional:      General: He is not in acute distress.    Appearance: He is well-developed.  HENT:     Head: Normocephalic and  atraumatic.     Mouth/Throat:     Mouth: Mucous membranes are moist.  Eyes:     Extraocular Movements: Extraocular movements intact.     Conjunctiva/sclera: Conjunctivae normal.     Pupils: Pupils are equal, round, and reactive to light.  Cardiovascular:     Rate and Rhythm: Normal rate and regular rhythm.     Heart sounds: No murmur heard. Pulmonary:     Effort: Pulmonary effort is normal. No respiratory distress.     Breath sounds: Normal breath sounds.  Abdominal:     Palpations: Abdomen is soft.     Tenderness: There is no abdominal tenderness. There is left CVA tenderness.  Musculoskeletal:        General: No swelling.     Cervical back: Neck supple.  Skin:    General: Skin is warm and dry.     Capillary Refill: Capillary refill takes less than 2 seconds.  Neurological:     Mental Status: He is alert. Mental status is at baseline.     Motor: No weakness.     Gait: Gait normal.  Psychiatric:        Mood and Affect: Mood normal.     ED Results / Procedures / Treatments   Labs (all  labs ordered are listed, but only abnormal results are displayed) Labs Reviewed  URINALYSIS, ROUTINE W REFLEX MICROSCOPIC - Abnormal; Notable for the following components:      Result Value   Color, Urine STRAW (*)    Protein, ur 100 (*)    All other components within normal limits  CBC WITH DIFFERENTIAL/PLATELET - Abnormal; Notable for the following components:   WBC 10.6 (*)    Neutro Abs 8.6 (*)    All other components within normal limits  COMPREHENSIVE METABOLIC PANEL WITH GFR - Abnormal; Notable for the following components:   Glucose, Bld 108 (*)    Creatinine, Ser 2.22 (*)    GFR, Estimated 29 (*)    All other components within normal limits  I-STAT CHEM 8, ED - Abnormal; Notable for the following components:   Creatinine, Ser 2.40 (*)    Glucose, Bld 108 (*)    All other components within normal limits  LIPASE, BLOOD    EKG None  Radiology No results  found.  Procedures Procedures    Medications Ordered in ED Medications  lactated ringers  bolus 1,000 mL (has no administration in time range)  ondansetron  (ZOFRAN ) injection 4 mg (has no administration in time range)  morphine  (PF) 4 MG/ML injection 4 mg (has no administration in time range)    ED Course/ Medical Decision Making/ A&P                                 Medical Decision Making This patient presents to the ED for concern of left flank pain, this involves an extensive number of treatment options, and is a complaint that carries with it a high risk of complications and morbidity.  The differential diagnosis includes stone, UTI, AKI, sepsis, electrolyte derangement, other   Co morbidities that complicate the patient evaluation :   CVA, history of kidney stones   Additional history obtained:  Additional history obtained from EMR External records from outside source obtained and reviewed including prior notes and labs   Lab Tests:  I Ordered, and personally interpreted labs.  The pertinent results include:  istat shows AKI, CBC reassuring, lipase normal, mo uti  Consults: I consulted with on-call urologist Dr. Derrick Fling.  He agrees patient needs to be admitted due to AKI with right proximal 5 mm ureteral stone.  Plan for admission to [facility] to hospitalist service.  If patient's kidney function is better tomorrow he can follow-up outpatient, but if not improved or worsen he will need ureteral stent.  ED course: Patient here with right flank pain.  He has a history of dementia and was generally wandering about the department, repeatedly stating he will to go home.  Discussed with the patient as well as his wife and his daughter who are at bedside that he has an AKI and will need fluids and admission to monitor kidney function and then possibly put stent in place if kidney function does not improve.  They are agreeable with this and ultimately patient is also  agreeable.  He did refuse morphine  for pain, so was given Tylenol .  He is not vomiting, abdomen soft and nontender he is not having chest pain or shortness of breath or other symptoms.  He had multiple incidental findings on his CT scan including a thoracic aortic aneurysm and multiple renal findings that radiology suggested he have outpatient MRI for.  Patient and family were informed of these  findings and need for outpatient follow-up.   Pending hospitalist consult at this time, signed out to Dr. Drury Geralds.   Amount and/or Complexity of Data Reviewed Labs: ordered. Radiology: ordered.  Risk OTC drugs. Prescription drug management. Decision regarding hospitalization.           Final Clinical Impression(s) / ED Diagnoses Final diagnoses:  None    Rx / DC Orders ED Discharge Orders     None         Aimee Houseman, PA-C 07/07/23 [address] PA-C 07/07/23 1953    Aimee Houseman, PA-C 07/07/23 2022    Aimee Houseman, PA-C 07/07/23 2023    Merdis Stalling, MD 07/19/23 [phone]

## 2023-07-07 NOTE — ED Notes (Signed)
 ED Provider at bedside.

## 2023-07-08 ENCOUNTER — Encounter (HOSPITAL_COMMUNITY): Payer: Self-pay | Admitting: Internal Medicine

## 2023-07-08 DIAGNOSIS — F0390 Unspecified dementia without behavioral disturbance: Secondary | ICD-10-CM | POA: Diagnosis present

## 2023-07-08 DIAGNOSIS — Z91041 Radiographic dye allergy status: Secondary | ICD-10-CM

## 2023-07-08 DIAGNOSIS — M109 Gout, unspecified: Secondary | ICD-10-CM | POA: Diagnosis present

## 2023-07-08 DIAGNOSIS — I129 Hypertensive chronic kidney disease with stage 1 through stage 4 chronic kidney disease, or unspecified chronic kidney disease: Secondary | ICD-10-CM | POA: Diagnosis present

## 2023-07-08 DIAGNOSIS — N201 Calculus of ureter: Secondary | ICD-10-CM | POA: Diagnosis present

## 2023-07-08 DIAGNOSIS — I10 Essential (primary) hypertension: Secondary | ICD-10-CM | POA: Diagnosis present

## 2023-07-08 DIAGNOSIS — E7800 Pure hypercholesterolemia, unspecified: Secondary | ICD-10-CM | POA: Diagnosis present

## 2023-07-08 DIAGNOSIS — Z6830 Body mass index (BMI) 30.0-30.9, adult: Secondary | ICD-10-CM

## 2023-07-08 DIAGNOSIS — N200 Calculus of kidney: Secondary | ICD-10-CM | POA: Diagnosis not present

## 2023-07-08 DIAGNOSIS — Z881 Allergy status to other antibiotic agents status: Secondary | ICD-10-CM

## 2023-07-08 DIAGNOSIS — Z888 Allergy status to other drugs, medicaments and biological substances status: Secondary | ICD-10-CM | POA: Diagnosis not present

## 2023-07-08 DIAGNOSIS — Z743 Need for continuous supervision: Secondary | ICD-10-CM | POA: Diagnosis not present

## 2023-07-08 DIAGNOSIS — Z8673 Personal history of transient ischemic attack (TIA), and cerebral infarction without residual deficits: Secondary | ICD-10-CM

## 2023-07-08 DIAGNOSIS — N179 Acute kidney failure, unspecified: Secondary | ICD-10-CM | POA: Diagnosis present

## 2023-07-08 DIAGNOSIS — Z79899 Other long term (current) drug therapy: Secondary | ICD-10-CM

## 2023-07-08 DIAGNOSIS — R112 Nausea with vomiting, unspecified: Secondary | ICD-10-CM | POA: Diagnosis not present

## 2023-07-08 DIAGNOSIS — Z905 Acquired absence of kidney: Secondary | ICD-10-CM | POA: Diagnosis not present

## 2023-07-08 DIAGNOSIS — R001 Bradycardia, unspecified: Secondary | ICD-10-CM | POA: Diagnosis present

## 2023-07-08 DIAGNOSIS — Z87442 Personal history of urinary calculi: Secondary | ICD-10-CM | POA: Diagnosis not present

## 2023-07-08 DIAGNOSIS — F32A Depression, unspecified: Secondary | ICD-10-CM | POA: Diagnosis present

## 2023-07-08 DIAGNOSIS — E66811 Obesity, class 1: Secondary | ICD-10-CM | POA: Diagnosis present

## 2023-07-08 DIAGNOSIS — G9341 Metabolic encephalopathy: Secondary | ICD-10-CM | POA: Diagnosis present

## 2023-07-08 DIAGNOSIS — Z7902 Long term (current) use of antithrombotics/antiplatelets: Secondary | ICD-10-CM | POA: Diagnosis not present

## 2023-07-08 DIAGNOSIS — I160 Hypertensive urgency: Secondary | ICD-10-CM | POA: Diagnosis present

## 2023-07-08 DIAGNOSIS — N184 Chronic kidney disease, stage 4 (severe): Secondary | ICD-10-CM | POA: Diagnosis present

## 2023-07-08 DIAGNOSIS — R1084 Generalized abdominal pain: Secondary | ICD-10-CM | POA: Diagnosis not present

## 2023-07-08 DIAGNOSIS — N132 Hydronephrosis with renal and ureteral calculous obstruction: Principal | ICD-10-CM | POA: Diagnosis present

## 2023-07-08 DIAGNOSIS — I7121 Aneurysm of the ascending aorta, without rupture: Secondary | ICD-10-CM | POA: Diagnosis present

## 2023-07-08 LAB — COMPREHENSIVE METABOLIC PANEL WITH GFR
ALT: 12 U/L (ref 0–44)
AST: 19 U/L (ref 15–41)
Albumin: 3.2 g/dL — ABNORMAL LOW (ref 3.5–5.0)
Alkaline Phosphatase: 80 U/L (ref 38–126)
Anion gap: 7 (ref 5–15)
BUN: 22 mg/dL (ref 8–23)
CO2: 24 mmol/L (ref 22–32)
Calcium: 8.5 mg/dL — ABNORMAL LOW (ref 8.9–10.3)
Chloride: 104 mmol/L (ref 98–111)
Creatinine, Ser: 2.91 mg/dL — ABNORMAL HIGH (ref 0.61–1.24)
GFR, Estimated: 21 mL/min — ABNORMAL LOW (ref >60)
Glucose, Bld: 110 mg/dL — ABNORMAL HIGH (ref 70–99)
Potassium: 3.9 mmol/L (ref 3.5–5.1)
Sodium: 135 mmol/L (ref 135–145)
Total Bilirubin: 1.1 mg/dL (ref 0.0–1.2)
Total Protein: 6.1 g/dL — ABNORMAL LOW (ref 6.5–8.1)

## 2023-07-08 LAB — PHOSPHORUS: Phosphorus: 3.6 mg/dL (ref 2.5–4.6)

## 2023-07-08 LAB — CBC
HCT: 40.3 % (ref 39.0–52.0)
Hemoglobin: 13.3 g/dL (ref 13.0–17.0)
MCH: 29.6 pg (ref 26.0–34.0)
MCHC: 33 g/dL (ref 30.0–36.0)
MCV: 89.6 fL (ref 80.0–100.0)
Platelets: 219 10*3/uL (ref 150–400)
RBC: 4.5 MIL/uL (ref 4.22–5.81)
RDW: 13.6 % (ref 11.5–15.5)
WBC: 10.2 10*3/uL (ref 4.0–10.5)
nRBC: 0 % (ref 0.0–0.2)

## 2023-07-08 LAB — MAGNESIUM: Magnesium: 1.9 mg/dL (ref 1.7–2.4)

## 2023-07-08 MED ORDER — ASPIRIN 81 MG PO TBEC
81.0000 mg | DELAYED_RELEASE_TABLET | Freq: Every day | ORAL | Status: DC
  Administered 2023-07-09 – 2023-07-10 (×2): 81 mg via ORAL
  Filled 2023-07-08 (×2): qty 1

## 2023-07-08 MED ORDER — ACETAMINOPHEN 325 MG PO TABS
650.0000 mg | ORAL_TABLET | Freq: Four times a day (QID) | ORAL | Status: DC | PRN
  Administered 2023-07-08 – 2023-07-09 (×2): 650 mg via ORAL
  Filled 2023-07-08 (×2): qty 2

## 2023-07-08 MED ORDER — TRAZODONE HCL 50 MG PO TABS
25.0000 mg | ORAL_TABLET | Freq: Once | ORAL | Status: AC
  Administered 2023-07-08: 25 mg via ORAL
  Filled 2023-07-08: qty 1

## 2023-07-08 MED ORDER — HYDRALAZINE HCL 20 MG/ML IJ SOLN: 10.0000 mg | Freq: Four times a day (QID) | INTRAMUSCULAR | Status: DC | PRN

## 2023-07-08 MED ORDER — LABETALOL HCL 5 MG/ML IV SOLN: 10.0000 mg | Freq: Four times a day (QID) | INTRAVENOUS | Status: DC | PRN

## 2023-07-08 MED ORDER — ONDANSETRON HCL 4 MG PO TABS
4.0000 mg | ORAL_TABLET | Freq: Four times a day (QID) | ORAL | Status: AC | PRN
Start: 2023-07-08 — End: ?

## 2023-07-08 MED ORDER — HEPARIN SODIUM (PORCINE) 5000 UNIT/ML IJ SOLN
5000.0000 [IU] | Freq: Three times a day (TID) | INTRAMUSCULAR | Status: DC
  Administered 2023-07-08 – 2023-07-10 (×5): 5000 [IU] via SUBCUTANEOUS
  Filled 2023-07-08 (×4): qty 1

## 2023-07-08 MED ORDER — AMLODIPINE BESYLATE 5 MG PO TABS
5.0000 mg | ORAL_TABLET | Freq: Every day | ORAL | Status: DC
  Administered 2023-07-08 – 2023-07-10 (×3): 5 mg via ORAL
  Filled 2023-07-08 (×3): qty 1

## 2023-07-08 MED ORDER — ONDANSETRON HCL 4 MG/2ML IJ SOLN: 4.0000 mg | Freq: Four times a day (QID) | INTRAMUSCULAR | Status: DC | PRN

## 2023-07-08 MED ORDER — LACTATED RINGERS IV SOLN: INTRAVENOUS | Status: DC

## 2023-07-08 MED ORDER — ACETAMINOPHEN 650 MG RE SUPP
650.0000 mg | Freq: Four times a day (QID) | RECTAL | Status: AC | PRN
Start: 2023-07-08 — End: ?

## 2023-07-08 MED ORDER — TAMSULOSIN HCL 0.4 MG PO CAPS
0.4000 mg | ORAL_CAPSULE | Freq: Every day | ORAL | Status: DC
  Administered 2023-07-08 – 2023-07-09 (×2): 0.4 mg via ORAL
  Filled 2023-07-08 (×2): qty 1

## 2023-07-08 MED ORDER — LACTATED RINGERS IV SOLN
INTRAVENOUS | Status: DC
Start: 2023-07-08 — End: 2023-07-08

## 2023-07-08 MED ORDER — OXYCODONE HCL 5 MG PO TABS: 5.0000 mg | ORAL_TABLET | ORAL | Status: DC | PRN

## 2023-07-08 MED ORDER — CARVEDILOL 12.5 MG PO TABS
12.5000 mg | ORAL_TABLET | Freq: Two times a day (BID) | ORAL | Status: DC
  Administered 2023-07-08 – 2023-07-10 (×5): 12.5 mg via ORAL
  Filled 2023-07-08 (×5): qty 1

## 2023-07-08 MED ORDER — CLOPIDOGREL BISULFATE 75 MG PO TABS: 75.0000 mg | ORAL_TABLET | Freq: Every day | ORAL | Status: DC

## 2023-07-08 MED ORDER — MORPHINE SULFATE (PF) 2 MG/ML IV SOLN: 2.0000 mg | INTRAVENOUS | Status: DC | PRN

## 2023-07-08 MED ORDER — ATORVASTATIN CALCIUM 20 MG PO TABS
20.0000 mg | ORAL_TABLET | Freq: Every day | ORAL | Status: DC
  Administered 2023-07-08 – 2023-07-10 (×3): 20 mg via ORAL
  Filled 2023-07-08 (×3): qty 1

## 2023-07-08 NOTE — ED Notes (Signed)
 [facility] called patient on transport list

## 2023-07-08 NOTE — ED Notes (Signed)
 Daughter updated on pt status and transport.

## 2023-07-08 NOTE — Consult Note (Signed)
 Urology Consult   Reason for consult: right ureteral stone, AKI  History of Present Illness: John Joseph is a 78 y.o. who presented to the ED c/o acute right flank pain. Labs were notable for creatinine 2.22 (baseline ~1.4). CT scan was obtained - it shows an obstructing stone in the proximal right ureter with some associated hydronephrosis. There are two small stones in the kidney as well. The left kidney is atrophic. There is some evidence of scar on the right kidney - patient reports he had a partial nephrectomy at [facility].   Today, his pain control is improved. His creatinin has trended up to 3.79. 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   Current Outpatient Medications on File Prior to Encounter  Medication Sig Dispense Refill   amLODipine  (NORVASC ) 5 MG tablet Take 5 mg by mouth daily.     carvedilol  (COREG ) 12.5 MG tablet Take 1 tablet (12.5 mg total) by mouth 2 (two) times daily with a meal. 60 tablet 3   clopidogrel  (PLAVIX ) 75 MG tablet Take 75 mg by mouth daily.     colchicine 0.6 MG tablet Take 0.6 mg by mouth daily as needed (gout).       Scheduled Meds:  acetaminophen   1,000 mg Oral Once   amLODipine   5 mg Oral Daily   aspirin  EC  81 mg Oral Q breakfast   atorvastatin   20 mg Oral Daily   carvedilol   12.5 mg Oral BID WC   heparin   5,000 Units Subcutaneous Q8H   tamsulosin   0.4 mg Oral QPC supper   Continuous Infusions: PRN Meds:.acetaminophen  **OR** acetaminophen , hydrALAZINE , labetalol , morphine  injection, ondansetron  **OR** ondansetron  (ZOFRAN ) IV, oxyCODONE   Allergies:  Allergies   Allergen Reactions   Iodinated Contrast Media Other (See Comments)    Convulsions    Prednisone Other (See Comments)    Debilitated walking Unable to walk   Amlodipine  Swelling    Leg edema   Cephalexin Other (See Comments)    Abdominal cramps, headache, breaks out in sweat   Losartan Potassium Other (See Comments)    Headaches   Lisinopril Other (See Comments)    Flushing on face   Tadalafil Other (See Comments)    Headache    Family History  Family history unknown: Yes    Social History:  reports that he has never smoked. He has never been exposed to tobacco smoke. He has never used smokeless tobacco. He reports current alcohol use. He reports that he does not use drugs.  ROS: A complete review of systems was performed.  All systems are negative except for pertinent findings as noted.  Physical Exam:  Vital signs in last 24 hours: Temp:  [97.8 F (36.6 C)-98.6 F (37 C)] 97.8 F (36.6 C) (05/17 0448) Pulse Rate:  [67-80] 78 (05/17 0658) Resp:  [18-20] 20 (05/17 0448) BP: (129-180)/(81-112) 156/97 (05/17 0658) SpO2:  [86 %-97 %] 95 % (05/17 0658) Constitutional:  Alert and oriented, No acute distress Cardiovascular: Regular rate and rhythm Respiratory: Normal respiratory effort, Lungs clear bilaterally  GI: Abdomen is soft, nontender, nondistended, no abdominal masses Neurologic: Grossly intact, no focal deficits Psychiatric: Normal mood and affect  Laboratory Data:  Recent Labs    07/07/23 1514 07/07/23 1535 07/08/23 0226  WBC 10.6*  --  10.2  HGB 15.1 15.0 13.3  HCT 43.7 44.0 40.3  PLT 270  --  219    Recent Labs    07/07/23 1514 07/07/23 1535 07/08/23 0226 07/09/23 0626  NA 136 138 135 137  K 4.2 4.3 3.9 4.5  CL 100 101 104 103  GLUCOSE 108* 108* 110* 96  BUN 22 21 22  35*  CALCIUM  9.0  --  8.5* 9.0  CREATININE 2.22* 2.40* 2.91* 3.79*     Results for orders placed or performed during the hospital encounter of 07/07/23 (from the past 24  hours)  Renal function panel     Status: Abnormal   Collection Time: 07/09/23  6:26 AM  Result Value Ref Range   Sodium 137 135 - 145 mmol/L   Potassium 4.5 3.5 - 5.1 mmol/L   Chloride 103 98 - 111 mmol/L   CO2 27 22 - 32 mmol/L   Glucose, Bld 96 70 - 99 mg/dL   BUN 35 (H) 8 - 23 mg/dL   Creatinine, Ser 4.40 (H) 0.61 - 1.24 mg/dL   Calcium  9.0 8.9 - 10.3 mg/dL   Phosphorus 3.9 2.5 - 4.6 mg/dL   Albumin 3.4 (L) 3.5 - 5.0 g/dL   GFR, Estimated 15 (L) >60 mL/min   Anion gap 7 5 - 15   No results found for this or any previous visit (from the past 240 hours).  Renal Function: Recent Labs    07/07/23 1514 07/07/23 1535 07/08/23 0226 07/09/23 0626  CREATININE 2.22* 2.40* 2.91* 3.79*   Estimated Creatinine Clearance: 16.3 mL/min (A) (by C-G formula based on SCr of 3.79 mg/dL (H)).  Radiologic Imaging: CT Renal Stone Study Result Date: 07/07/2023 CLINICAL DATA:  Bilateral flank pain for 3 days. EXAM: CT ABDOMEN AND PELVIS WITHOUT CONTRAST TECHNIQUE: Multidetector CT imaging of the abdomen and pelvis was performed following the standard protocol without IV contrast. RADIATION DOSE REDUCTION: This exam was performed according to the departmental dose-optimization program which includes automated exposure control, adjustment of the mA and/or kV according to patient size and/or use of iterative reconstruction technique. COMPARISON:  11/13/2021 FINDINGS: Lower chest: Ascending thoracic aortic aneurysm 4.3 cm in diameter. Descending thoracic aortic atherosclerosis. Upper normal heart size. Atelectasis noted along both hemidiaphragms. Hepatobiliary: 1.1 cm gallstone in the gallbladder. Pancreas: Unremarkable Spleen: Unremarkable Adrenals/Urinary Tract: Both adrenal glands appear normal. Postoperative findings anteriorly along the right kidney lower pole. There is mild right hydronephrosis attributable to a 0.5 cm in long axis proximal ureteral calculus shown on image 50 of series 2. The right ureter  distal to this calculus is of normal caliber. Over 10 nonobstructive right renal calculi are present measuring up to 7 mm in diameter. Around 9 nonobstructive left renal calculi are present measuring up to 6 mm in long axis. Scattered left renal lesions varying density are present including some complex but homogeneous lesions, the largest of which measures 2.9 cm in diameter with internal density of 42 Hounsfield units. Urinary bladder unremarkable. Stomach/Bowel: Sigmoid colon diverticulosis. Vascular/Lymphatic: Atherosclerosis is present, including aortoiliac atherosclerotic disease. Reproductive: Substantial prostatomegaly. Small right scrotal hydrocele. Other: No supplemental non-categorized findings. Musculoskeletal: Small left groin hernia contains adipose tissue. Old healed fracture of the right eleventh rib. Lower lumbar spondylosis and degenerative disc disease causing  impingement at L3-4, L4-5, and L5-S1. IMPRESSION: 1. Mild right hydronephrosis attributable to a 0.5 cm in long axis proximal ureteral calculus. 2. Multiple bilateral nonobstructive renal calculi. 3. Scattered left renal lesions varying density are present including some complex but homogeneous lesions, the largest of which measures 2.9 cm in diameter with internal density of 42 Hounsfield units. When feasible, non emergent outpatient follow up renal protocol abdominal MRI with and without contrast is recommended for definitive characterization. 4. Cholelithiasis. 5. Substantial prostatomegaly. 6. Small right scrotal hydrocele. 7. Lower lumbar impingement. 8. Small left groin hernia contains adipose tissue. 9. Ascending thoracic aortic aneurysm 4.3 cm in diameter. Recommend annual imaging followup by CTA or MRA. This recommendation follows 2010 ACCF/AHA/AATS/ACR/ASA/SCA/SCAI/SIR/STS/SVM Guidelines for the Diagnosis and Management of Patients with Thoracic Aortic Disease. Circulation. 2010; 121: P295-J884. Aortic aneurysm NOS (ICD10-I71.9)  10.  Aortic Atherosclerosis (ICD10-I70.0). Electronically Signed   By: Freida Jes M.D.   On: 07/07/2023 18:36    I independently reviewed the above imaging studies.  Impression/Recommendation 78 yo M with right obstructing ureteral stone. Given the atrophy of the left kidney I suspect he may effectively be solitary kidney function-wise. I recommended a ureteral stent later today. Procedure and risks reviewed with patient and family (hematuria, stent pain, procedure failure, infection, sepsis, need for future procedure, retention).   Julene Oaks MD 07/09/2023, 9:17 AM  [facility]  Pager: [phone]

## 2023-07-08 NOTE — Progress Notes (Signed)
 Patient noted to have exertional dyspnea when walking to and from bathroom. When I asked if this was normal for him he stated "I have been more out of breath than normal lately." Had a slight audible wheezy sound, and I noted his HR seemed irregular also when I listened to him.

## 2023-07-08 NOTE — TOC CM/SW Note (Signed)
 Transition of Care [facility]) - Inpatient Brief Assessment   Patient Details  Name: John Joseph MRN: [id number] Date of Birth: [birth year]  Transition of Care [facility]) CM/SW Contact:    Cyndie Dredge, LCSWA Phone Number: 07/08/2023, 7:05 AM   Clinical Narrative:  Transition of Care Department [facility]) has reviewed patient and no TOC needs have been identified at this time. We will continue to monitor patient advancement through interdisciplinary progression rounds. If new patient transition needs arise, please place a TOC consult.  Transition of Care Asessment: Insurance and Status: Insurance coverage has been reviewed Patient has primary care physician: Yes Home environment has been reviewed: Single Family Home Prior level of function:: Independent Prior/Current Home Services: No current home services Social Drivers of Health Review: SDOH reviewed no interventions necessary Readmission risk has been reviewed: Yes Transition of care needs: no transition of care needs at this time

## 2023-07-08 NOTE — ED Notes (Signed)
Pt has left department with [facility]

## 2023-07-08 NOTE — H&P (Addendum)
 History and Physical    Patient: John Joseph WUJ:[id number] DOB: 1946-01-14 DOA: 07/07/2023 DOS: the patient was seen and examined on 07/08/2023 PCP: Olin Bertin, MD  Patient coming from: Home  Chief Complaint:  Chief Complaint  Patient presents with   Flank Pain   HPI: John Joseph is a 78 y.o. male with medical history significant of hypertension who presents to the emergency department due to left-sided flank pain which started on the night of 07/06/2023 and was associated with nausea and NBNB vomiting.  Patient states pain was similar to prior kidney stone (about 20 years ago).  He denies chest pain, shortness of breath.  ED Course:  In the emergency department, BP was 178/106, other vital signs are within normal range.  Workup in the ED showed normal CBC except for WBC of 10.6, normal BMP except for blood glucose of 108 and creatinine of 2.22.  Lipase 37, urinalysis was normal. CT abdomen and pelvis without contrast showed mild right hydronephrosis attributable to 0.5 cm in long axis proximal ureteral calculus. Urologist (Dr. Derrick Fling) was consulted by ED PA and it was recommended for patient to be admitted to hospitalist service at [facility] and treat the acute kidney injury.  If patient's kidney function improves patient can follow-up as outpatient, however, if no improvement or there was worsening in kidney function, urologist can be reconsulted for ureteral stent.  [facility] was asked to admit patient for further evaluation and management.  IV morphine 4 mg x 1 was given, Tylenol  was given, Zofran was given.  IV hydration was provided.  Review of Systems: Review of systems as noted in the HPI. All other systems reviewed and are negative.   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)    Past Surgical History:   Procedure Laterality Date   KIDNEY SURGERY      Social History:  reports that he has never smoked. He has never been exposed to tobacco smoke. He has never used smokeless tobacco. He reports current alcohol use. He reports that he does not use drugs.   Allergies  Allergen Reactions   Iodinated Contrast Media Other (See Comments)    Convulsions    Prednisone Other (See Comments)    Debilitated walking Unable to walk   Amlodipine  Swelling    Leg edema   Cephalexin Other (See Comments)    Abdominal cramps, headache, breaks out in sweat   Losartan Potassium Other (See Comments)    Headaches   Lisinopril Other (See Comments)    Flushing on face   Tadalafil Other (See Comments)    Headache    Family History  Family history unknown: Yes     Prior to Admission medications   Medication Sig Start Date End Date Taking? Authorizing Provider  amLODipine  (NORVASC ) 5 MG tablet Take 5 mg by mouth daily.   Yes [provider]  carvedilol  (COREG ) 12.5 MG tablet Take 1 tablet (12.5 mg total) by mouth 2 (two) times daily with a meal. 10/22/18  Yes Danford, Willis Harter, MD  clopidogrel  (PLAVIX ) 75 MG tablet Take 75 mg by mouth daily.   Yes [provider]  colchicine 0.6 MG tablet Take 0.6 mg by mouth daily as needed (gout).   Yes [provider]    Physical Exam:  BP (!) 157/103   Pulse 71   Temp 98 F (36.7 C) (Oral)   Resp 16   Ht 5\' 6"  (1.676 m)   Wt 87.1 kg   SpO2 92%   BMI 30.99 kg/m   General: 78 y.o. year-old male well developed well nourished in no acute distress.  Alert and oriented x3. HEENT: NCAT, EOMI Neck: Supple, trachea medial Cardiovascular: Regular rate and rhythm with no rubs or gallops.  No thyromegaly or JVD noted.  No lower extremity edema. 2/4 pulses in all 4 extremities. Respiratory: Clear to auscultation with no wheezes or rales. Good inspiratory effort. Abdomen: Soft, left CVA tenderness.  Normal bowel sounds x4  quadrants. Muskuloskeletal: No cyanosis, clubbing or edema noted bilaterally Neuro: CN II-XII intact, strength 5/5 x 4, sensation, reflexes intact Skin: No ulcerative lesions noted or rashes Psychiatry: Judgement and insight appear normal. Mood is appropriate for condition and setting          Labs on Admission:  Basic Metabolic Panel: Recent Labs  Lab 07/07/23 1514 07/07/23 1535  NA 136 138  K 4.2 4.3  CL 100 101  CO2 26  --   GLUCOSE 108* 108*  BUN 22 21  CREATININE 2.22* 2.40*  CALCIUM  9.0  --    Liver Function Tests: Recent Labs  Lab 07/07/23 1514  AST 18  ALT 13  ALKPHOS 89  BILITOT 1.1  PROT 6.9  ALBUMIN 3.6   Recent Labs  Lab 07/07/23 1514  LIPASE 37   No results for input(s): "AMMONIA" in the last 168 hours. CBC: Recent Labs  Lab 07/07/23 1514 07/07/23 1535  WBC 10.6*  --   NEUTROABS 8.6*  --   HGB 15.1 15.0  HCT 43.7 44.0  MCV 88.5  --   PLT 270  --    Cardiac Enzymes: No results for input(s): "CKTOTAL", "CKMB", "CKMBINDEX", "TROPONINI" in the last 168 hours.  BNP (last 3 results) No results for input(s): "BNP" in the last 8760 hours.  ProBNP (last 3 results) No results for input(s): "PROBNP" in the last 8760 hours.  CBG: No results for input(s): "GLUCAP" in the last 168 hours.  Radiological Exams on Admission: CT Renal Stone Study Result Date: 07/07/2023 CLINICAL DATA:  Bilateral flank pain for 3 days. EXAM: CT ABDOMEN AND PELVIS WITHOUT CONTRAST TECHNIQUE: Multidetector CT imaging of the abdomen and pelvis was performed following the standard protocol without IV contrast. RADIATION DOSE REDUCTION: This exam was performed according to the departmental dose-optimization program which includes automated exposure control, adjustment of the mA and/or kV according to patient size and/or use of iterative reconstruction technique. COMPARISON:  11/13/2021 FINDINGS: Lower chest: Ascending thoracic aortic aneurysm 4.3 cm in diameter. Descending  thoracic aortic atherosclerosis. Upper normal heart size. Atelectasis noted along both hemidiaphragms. Hepatobiliary: 1.1 cm gallstone in the gallbladder. Pancreas: Unremarkable Spleen: Unremarkable Adrenals/Urinary Tract: Both adrenal glands appear normal. Postoperative findings anteriorly along the right kidney lower pole. There is mild right hydronephrosis attributable to a 0.5 cm in long axis proximal ureteral calculus shown on image 50 of series 2. The right ureter distal to this calculus is of normal caliber. Over 10 nonobstructive right renal calculi are present measuring up to 7 mm in diameter. Around 9 nonobstructive left renal calculi are present measuring up to 6 mm in long axis. Scattered left renal lesions varying density are present including some complex but homogeneous lesions, the largest of which measures 2.9 cm in diameter with internal density of 42 Hounsfield units. Urinary  bladder unremarkable. Stomach/Bowel: Sigmoid colon diverticulosis. Vascular/Lymphatic: Atherosclerosis is present, including aortoiliac atherosclerotic disease. Reproductive: Substantial prostatomegaly. Small right scrotal hydrocele. Other: No supplemental non-categorized findings. Musculoskeletal: Small left groin hernia contains adipose tissue. Old healed fracture of the right eleventh rib. Lower lumbar spondylosis and degenerative disc disease causing impingement at L3-4, L4-5, and L5-S1. IMPRESSION: 1. Mild right hydronephrosis attributable to a 0.5 cm in long axis proximal ureteral calculus. 2. Multiple bilateral nonobstructive renal calculi. 3. Scattered left renal lesions varying density are present including some complex but homogeneous lesions, the largest of which measures 2.9 cm in diameter with internal density of 42 Hounsfield units. When feasible, non emergent outpatient follow up renal protocol abdominal MRI with and without contrast is recommended for definitive characterization. 4. Cholelithiasis. 5.  Substantial prostatomegaly. 6. Small right scrotal hydrocele. 7. Lower lumbar impingement. 8. Small left groin hernia contains adipose tissue. 9. Ascending thoracic aortic aneurysm 4.3 cm in diameter. Recommend annual imaging followup by CTA or MRA. This recommendation follows 2010 ACCF/AHA/AATS/ACR/ASA/SCA/SCAI/SIR/STS/SVM Guidelines for the Diagnosis and Management of Patients with Thoracic Aortic Disease. Circulation. 2010; 121: Z610-R604. Aortic aneurysm NOS (ICD10-I71.9) 10.  Aortic Atherosclerosis (ICD10-I70.0). Electronically Signed   By: Freida Jes M.D.   On: 07/07/2023 18:36    EKG: I independently viewed the EKG done and my findings are as followed: EKG was not done in the ED  Assessment/Plan Present on Admission:  Hypertensive urgency  Principal Problem:   Nephrolithiasis Active Problems:   Hypertensive urgency   Acute kidney injury superimposed on stage 4 chronic kidney disease (HCC)   Essential hypertension   History of CVA (cerebrovascular accident)   Obesity, Class I, BMI 30-34.9   Nausea & vomiting  Nephrolithiasis CT abdomen and pelvis showed mild right hydronephrosis due to to 0.5 cm ureteral calculus Continue IV hydration and monitor for passing of the stone Tylenol  as needed for mild pain, oxycodone for moderate/severe pain  Nausea and vomiting Continue Zofran as needed  Acute kidney injury on CKD stage 4 Continue gentle hydration Renally adjust medications, avoid nephrotoxic agents/dehydration/hypotension Consider urology consult for possible ureteral stent if kidney function continues to worsen/does not improve  Hypertensive urgency-resolved Essential hypertension-uncontrolled Continue IV labetalol 10 mg every 6 hours as needed for SBP > 180 mmHg or DBP > 110 mmHg Continue home Coreg , amlodipine   History of CVA Continue Plavix  per home regimen Patient was not on any statin per med rec  Obesity class I (BMI 30.99) Diet and lifestyle  modification  DVT prophylaxis: Heparin subcu  Code Status: Full code  Family Communication: None  Consults: None  Severity of Illness: The appropriate patient status for this patient is INPATIENT. Inpatient status is judged to be reasonable and necessary in order to provide the required intensity of service to ensure the patient's safety. The patient's presenting symptoms, physical exam findings, and initial radiographic and laboratory data in the context of their chronic comorbidities is felt to place them at high risk for further clinical deterioration. Furthermore, it is not anticipated that the patient will be medically stable for discharge from the hospital within 2 midnights of admission.   * I certify that at the point of admission it is my clinical judgment that the patient will require inpatient hospital care spanning beyond 2 midnights from the point of admission due to high intensity of service, high risk for further deterioration and high frequency of surveillance required.*  Author: Durga Saldarriaga, DO 07/08/2023 1:08 AM  For on call review [email or web address].

## 2023-07-08 NOTE — Progress Notes (Signed)
 Patient seen and evaluated, chart reviewed, please see EMR for updated orders. Please see full H&P dictated by admitting physician Dr. Adefeso for same date of service.   Brief Summary:-  78 y.o. male with medical history significant of hypertension who presents to the emergency department due to left-sided flank pain which started on the night of 07/06/2023 and was associated with nausea and NBNB vomiting admitted on 07/08/2023 with AKI on CKD stage IV in the setting of nephrolithiasis and hypertensive urgency  A/p 1)AKI----acute kidney injury on CKD stage - IV--due to GI losses in the setting of emesis compounded by obstructive uropathy due to nephrolithiasis with mild right hydronephrosis -  creatinine on admission= 2.22  ,  baseline creatinine = 1.2 to 1.3   ,  -creatinine is now= 2.91 ,  -Continue IV fluids -Awaiting urology input --renally adjust medications, avoid nephrotoxic agents / dehydration  / hypotension  2)Nephrolithiasis with mild right-sided hydronephrosis----right flank pain - CT renal protocol shows  Mild right hydronephrosis attributable to a 0.5 cm in long axis proximal ureteral calculus and  Multiple bilateral nonobstructive renal calculi. - Continue Flomax  -await urology input  3)H/o Prior CVA-- - LDL was 48 and HDL 36 back in November 2024 Even if his lipid panel is within desired limits, patient should still take atorvastatin  for it's Pleiotropic effects (beyond cholesterol lowering benefits) - PTA patient was on Plavix  for secondary stroke prophylaxis - Hold Plavix  for now in case patient needs urological intervention for kidney stones - Use aspirin  81 mg for now - At discharge or once urological procedures have been completed consider switching back to Plavix  from aspirin   4)HTN--history of uncontrolled hypertension--- okay to restart amlodipine  and Coreg  - May use IV hydralazine  as needed elevated BP  5)Ascending thoracic aortic aneurysm--CT abdomen shows  Ascending thoracic aortic aneurysm 4.3 cm in diameter - Patient will need CTA or MRA when renal function is more stable  6) acute metabolic encephalopathy--- patient with confusion episodes, suspect #1 is contributory  Patient seen and evaluated, chart reviewed, please see EMR for updated orders. Please see full H&P dictated by admitting physician Dr. Adefeso for same date of service.  ---  Total care time 47 minutes  Colin Dawley, MD

## 2023-07-09 ENCOUNTER — Inpatient Hospital Stay (HOSPITAL_COMMUNITY): Admitting: Anesthesiology

## 2023-07-09 ENCOUNTER — Encounter (HOSPITAL_COMMUNITY)
Admission: EM | Disposition: A | Discharge status: Home / Self Care | Payer: Self-pay | Source: Home / Self Care | Attending: Family Medicine

## 2023-07-09 ENCOUNTER — Inpatient Hospital Stay (HOSPITAL_COMMUNITY)

## 2023-07-09 DIAGNOSIS — N132 Hydronephrosis with renal and ureteral calculous obstruction: Secondary | ICD-10-CM

## 2023-07-09 DIAGNOSIS — F32A Depression, unspecified: Secondary | ICD-10-CM | POA: Diagnosis not present

## 2023-07-09 DIAGNOSIS — I129 Hypertensive chronic kidney disease with stage 1 through stage 4 chronic kidney disease, or unspecified chronic kidney disease: Secondary | ICD-10-CM

## 2023-07-09 DIAGNOSIS — N184 Chronic kidney disease, stage 4 (severe): Secondary | ICD-10-CM | POA: Diagnosis not present

## 2023-07-09 DIAGNOSIS — N200 Calculus of kidney: Secondary | ICD-10-CM

## 2023-07-09 HISTORY — PX: CYSTOSCOPY W/ URETERAL STENT PLACEMENT: SHX1429

## 2023-07-09 LAB — RENAL FUNCTION PANEL
Albumin: 3.4 g/dL — ABNORMAL LOW (ref 3.5–5.0)
Anion gap: 7 (ref 5–15)
BUN: 35 mg/dL — ABNORMAL HIGH (ref 8–23)
CO2: 27 mmol/L (ref 22–32)
Calcium: 9 mg/dL (ref 8.9–10.3)
Chloride: 103 mmol/L (ref 98–111)
Creatinine, Ser: 3.79 mg/dL — ABNORMAL HIGH (ref 0.61–1.24)
GFR, Estimated: 15 mL/min — ABNORMAL LOW (ref >60)
Glucose, Bld: 96 mg/dL (ref 70–99)
Phosphorus: 3.9 mg/dL (ref 2.5–4.6)
Potassium: 4.5 mmol/L (ref 3.5–5.1)
Sodium: 137 mmol/L (ref 135–145)

## 2023-07-09 SURGERY — CYSTOSCOPY, WITH RETROGRADE PYELOGRAM AND URETERAL STENT INSERTION
Anesthesia: General | Site: Ureter | Laterality: Right

## 2023-07-09 MED ORDER — LIDOCAINE HCL (PF) 2 % IJ SOLN
INTRAMUSCULAR | Status: DC | PRN
  Administered 2023-07-09: 60 mg via INTRADERMAL

## 2023-07-09 MED ORDER — IOHEXOL 300 MG/ML  SOLN
INTRAMUSCULAR | Status: DC | PRN
  Administered 2023-07-09: 8 mL

## 2023-07-09 MED ORDER — FENTANYL CITRATE (PF) 100 MCG/2ML IJ SOLN
INTRAMUSCULAR | Status: AC
  Filled 2023-07-09: qty 2

## 2023-07-09 MED ORDER — ACETAMINOPHEN 10 MG/ML IV SOLN
INTRAVENOUS | Status: AC
Start: 2023-07-09 — End: ?
  Filled 2023-07-09: qty 100

## 2023-07-09 MED ORDER — LACTATED RINGERS IV SOLN: INTRAVENOUS | Status: DC | PRN

## 2023-07-09 MED ORDER — HYDRALAZINE HCL 20 MG/ML IJ SOLN: 10.0000 mg | Freq: Four times a day (QID) | INTRAMUSCULAR | Status: DC | PRN

## 2023-07-09 MED ORDER — ACETAMINOPHEN 10 MG/ML IV SOLN
INTRAVENOUS | Status: DC | PRN
  Administered 2023-07-09: 1000 mg via INTRAVENOUS

## 2023-07-09 MED ORDER — CEFAZOLIN SODIUM-DEXTROSE 2-4 GM/100ML-% IV SOLN
INTRAVENOUS | Status: AC
  Filled 2023-07-09: qty 100

## 2023-07-09 MED ORDER — PROPOFOL 10 MG/ML IV BOLUS
INTRAVENOUS | Status: AC
  Filled 2023-07-09: qty 20

## 2023-07-09 MED ORDER — PHENYLEPHRINE 80 MCG/ML (10ML) SYRINGE FOR IV PUSH (FOR BLOOD PRESSURE SUPPORT)
PREFILLED_SYRINGE | INTRAVENOUS | Status: DC | PRN
  Administered 2023-07-09 (×3): 80 ug via INTRAVENOUS

## 2023-07-09 MED ORDER — PROPOFOL 10 MG/ML IV BOLUS
INTRAVENOUS | Status: DC | PRN
Start: 2023-07-09 — End: 2023-07-09
  Administered 2023-07-09: 100 mg via INTRAVENOUS

## 2023-07-09 MED ORDER — ONDANSETRON HCL 4 MG/2ML IJ SOLN
INTRAMUSCULAR | Status: DC | PRN
  Administered 2023-07-09: 4 mg via INTRAVENOUS

## 2023-07-09 MED ORDER — FENTANYL CITRATE (PF) 250 MCG/5ML IJ SOLN
INTRAMUSCULAR | Status: DC | PRN
  Administered 2023-07-09: 50 ug via INTRAVENOUS

## 2023-07-09 MED ORDER — WATER FOR IRRIGATION, STERILE IR SOLN
Status: DC | PRN
Start: 2023-07-09 — End: 2023-07-09
  Administered 2023-07-09: 3000 mL

## 2023-07-09 SURGICAL SUPPLY — 12 items
BAG URO CATCHER STRL LF (MISCELLANEOUS) ×1 IMPLANT
CATH URETL OPEN 5X70 (CATHETERS) ×1 IMPLANT
CLOTH BEACON ORANGE TIMEOUT ST (SAFETY) ×1 IMPLANT
GLOVE BIO SURGEON STRL SZ7 (GLOVE) ×1 IMPLANT
GOWN STRL REUS W/ TWL XL LVL3 (GOWN DISPOSABLE) ×1 IMPLANT
GUIDEWIRE SENSOR ANG DUAL FLEX (WIRE) IMPLANT
GUIDEWIRE ZIPWRE .038 STRAIGHT (WIRE) ×1 IMPLANT
KIT TURNOVER KIT A (KITS) IMPLANT
MANIFOLD NEPTUNE II (INSTRUMENTS) ×1 IMPLANT
PACK CYSTO (CUSTOM PROCEDURE TRAY) ×1 IMPLANT
STENT URET 6FRX26 CONTOUR (STENTS) IMPLANT
TUBING CONNECTING 10 (TUBING) ×1 IMPLANT

## 2023-07-09 NOTE — Transfer of Care (Signed)
 Immediate Anesthesia Transfer of Care Note  Patient: John Joseph  Procedure(s) Performed: CYSTOSCOPY, WITH RETROGRADE PYELOGRAM AND URETERAL STENT INSERTION (Right: Ureter)  Patient Location: PACU  Anesthesia Type:General  Level of Consciousness: sedated  Airway & Oxygen Therapy: Patient Spontanous Breathing  Post-op Assessment: Report given to RN and Post -op Vital signs reviewed and stable  Post vital signs: Reviewed and stable  Last Vitals:  Vitals Value Taken Time  BP 116/83 07/09/23 1116  Temp    Pulse 68 07/09/23 1118  Resp 14 07/09/23 1118  SpO2 92 % 07/09/23 1118  Vitals shown include unfiled device data.  Last Pain:  Vitals:   07/09/23 0954  TempSrc: Oral  PainSc:       Patients Stated Pain Goal: 0 (07/08/23 2034)  Complications: No notable events documented.

## 2023-07-09 NOTE — Anesthesia Preprocedure Evaluation (Addendum)
 Anesthesia Evaluation  Patient identified by MRN, date of birth, ID band Patient awake    Reviewed: Allergy & Precautions, H&P , NPO status , Patient's Chart, lab work & pertinent test results  Airway Mallampati: II  TM Distance: >3 FB Neck ROM: Full    Dental no notable dental hx. (+) Teeth Intact, Dental Advisory Given   Pulmonary neg pulmonary ROS   Pulmonary exam normal breath sounds clear to auscultation       Cardiovascular hypertension, Pt. on medications  Rhythm:Regular Rate:Normal     Neuro/Psych    Depression    CVA, Residual Symptoms    GI/Hepatic negative GI ROS, Neg liver ROS,,,  Endo/Other  negative endocrine ROS    Renal/GU Renal disease  negative genitourinary   Musculoskeletal   Abdominal   Peds  Hematology negative hematology ROS (+)   Anesthesia Other Findings   Reproductive/Obstetrics negative OB ROS                             Anesthesia Physical Anesthesia Plan  ASA: 3  Anesthesia Plan: General   Post-op Pain Management: Ofirmev  IV (intra-op)*   Induction: Intravenous  PONV Risk Score and Plan: 3 and Ondansetron  and Dexamethasone  Airway Management Planned: LMA  Additional Equipment:   Intra-op Plan:   Post-operative Plan: Extubation in OR  Informed Consent: I have reviewed the patients History and Physical, chart, labs and discussed the procedure including the risks, benefits and alternatives for the proposed anesthesia with the patient or authorized representative who has indicated his/her understanding and acceptance.     Dental advisory given  Plan Discussed with: CRNA  Anesthesia Plan Comments:        Anesthesia Quick Evaluation

## 2023-07-09 NOTE — Progress Notes (Signed)
 PROGRESS NOTE    John Joseph  VHQ:[id number] DOB: 02/04/1946 DOA: 07/07/2023 PCP: Olin Bertin, MD   Brief Narrative:  John Joseph is a 78 y.o. male with medical history significant of hypertension who presents to the emergency department due to left-sided flank pain which started on the night of 07/06/2023 and was associated with nausea and vomiting.  CT abdomen and pelvis confirms right hydronephrosis with 0.5 cm proximal ureteral calculus.  Hospitalist called for admission, urology called in consult and recommended transfer to [facility] for intervention.  Assessment & Plan:   Principal Problem:   Nephrolithiasis Active Problems:   Hypertensive urgency   Acute kidney injury superimposed on stage 4 chronic kidney disease (HCC)   Essential hypertension   History of CVA (cerebrovascular accident)   Obesity, Class I, BMI 30-34.9   Nausea & vomiting   AKI on CKD stage IV Obstructive uropathy, acute Nephrolithiasis, acute -Urology consulted as above, status post stent placement on 07/09/2023, tolerated quite well by the patient - Creatinine continues to rise in the setting of obstructive uropathy.  Given resolution would expect downtrending creatinine over the next 24 to 48 hours, baseline creatinine around 1.4 - Continue Flomax , supportive care, pain control as well as nausea control   CVA history -Chronically on Plavix  complicating above, currently held for procedure - Continue 81 mg aspirin  in the interim, likely discharge on Plavix  pending urology recommendations/timing   Essential hypertension, uncontrolled  -Resume amlodipine  -Carvedilol  on hold in the setting of borderline bradycardia - May use IV hydralazine  as needed elevated BP   Ascending thoracic aortic aneurysm CT abdomen shows Ascending thoracic aortic aneurysm 4.3 cm in diameter Patient will need CTA or MRA when renal function is more stable -likely will need to be an outpatient follow-up   Acute metabolic  encephalopathy, resolved  - Likely secondary to above with noted uremia  DVT prophylaxis: heparin  injection 5,000 Units Start: 07/08/23 0600 SCDs Start: 07/08/23 0049 Code Status:   Code Status: Full Code Family Communication: None present  Status is: Inpatient  Dispo: The patient is from: Home              Anticipated d/c is to: Home              Anticipated d/c date is: 24 to 48 hours              Patient currently not medically stable for discharge  Consultants:  Urology  Procedures:  Right ureteral stent placement 07/09/2023  Antimicrobials:  None  Subjective: No acute issues or events overnight, pain currently well-controlled denies nausea vomiting diarrhea constipation headache fevers chills or chest pain  Objective: Vitals:   07/08/23 2036 07/09/23 0446 07/09/23 0448 07/09/23 0658  BP: (!) 165/101 (!) 178/100 (!) 179/112 (!) 156/97  Pulse: 72 80 78 78  Resp: 18 20 20    Temp: 98.2 F (36.8 C)  97.8 F (36.6 C)   TempSrc: Oral  Oral   SpO2: 97% 96% 96% 95%  Weight:      Height:        Intake/Output Summary (Last 24 hours) at 07/09/2023 0719 Last data filed at 07/09/2023 0612 Gross per 24 hour  Intake 240 ml  Output 750 ml  Net -510 ml   Filed Weights   07/07/23 1358  Weight: 87.1 kg    Examination:  General:  Pleasantly resting in bed, No acute distress. HEENT:  Normocephalic atraumatic.  Sclerae nonicteric, noninjected.  Extraocular movements intact bilaterally. Neck:  Without  mass or deformity.  Trachea is midline. Lungs:  Clear to auscultate bilaterally without rhonchi, wheeze, or rales. Heart:  Regular rate and rhythm.  Without murmurs, rubs, or gallops. Abdomen:  Soft, nontender, nondistended.  Right flank pain on palpation Extremities: Without cyanosis, clubbing, edema, or obvious deformity. Skin:  Warm and dry, no erythema.   Data Reviewed: I have personally reviewed following labs and imaging studies  CBC: Recent Labs  Lab 07/07/23 1514  07/07/23 1535 07/08/23 0226  WBC 10.6*  --  10.2  NEUTROABS 8.6*  --   --   HGB 15.1 15.0 13.3  HCT 43.7 44.0 40.3  MCV 88.5  --  89.6  PLT 270  --  219   Basic Metabolic Panel: Recent Labs  Lab 07/07/23 1514 07/07/23 1535 07/08/23 0226  NA 136 138 135  K 4.2 4.3 3.9  CL 100 101 104  CO2 26  --  24  GLUCOSE 108* 108* 110*  BUN 22 21 22   CREATININE 2.22* 2.40* 2.91*  CALCIUM  9.0  --  8.5*  MG  --   --  1.9  PHOS  --   --  3.6   GFR: Estimated Creatinine Clearance: 21.3 mL/min (A) (by C-G formula based on SCr of 2.91 mg/dL (H)). Liver Function Tests: Recent Labs  Lab 07/07/23 1514 07/08/23 0226  AST 18 19  ALT 13 12  ALKPHOS 89 80  BILITOT 1.1 1.1  PROT 6.9 6.1*  ALBUMIN 3.6 3.2*   Recent Labs  Lab 07/07/23 1514  LIPASE 37   No results found for this or any previous visit (from the past 240 hours).   Radiology Studies: CT Renal Stone Study Result Date: 07/07/2023 CLINICAL DATA:  Bilateral flank pain for 3 days. EXAM: CT ABDOMEN AND PELVIS WITHOUT CONTRAST TECHNIQUE: Multidetector CT imaging of the abdomen and pelvis was performed following the standard protocol without IV contrast. RADIATION DOSE REDUCTION: This exam was performed according to the departmental dose-optimization program which includes automated exposure control, adjustment of the mA and/or kV according to patient size and/or use of iterative reconstruction technique. COMPARISON:  11/13/2021 FINDINGS: Lower chest: Ascending thoracic aortic aneurysm 4.3 cm in diameter. Descending thoracic aortic atherosclerosis. Upper normal heart size. Atelectasis noted along both hemidiaphragms. Hepatobiliary: 1.1 cm gallstone in the gallbladder. Pancreas: Unremarkable Spleen: Unremarkable Adrenals/Urinary Tract: Both adrenal glands appear normal. Postoperative findings anteriorly along the right kidney lower pole. There is mild right hydronephrosis attributable to a 0.5 cm in long axis proximal ureteral calculus shown  on image 50 of series 2. The right ureter distal to this calculus is of normal caliber. Over 10 nonobstructive right renal calculi are present measuring up to 7 mm in diameter. Around 9 nonobstructive left renal calculi are present measuring up to 6 mm in long axis. Scattered left renal lesions varying density are present including some complex but homogeneous lesions, the largest of which measures 2.9 cm in diameter with internal density of 42 Hounsfield units. Urinary bladder unremarkable. Stomach/Bowel: Sigmoid colon diverticulosis. Vascular/Lymphatic: Atherosclerosis is present, including aortoiliac atherosclerotic disease. Reproductive: Substantial prostatomegaly. Small right scrotal hydrocele. Other: No supplemental non-categorized findings. Musculoskeletal: Small left groin hernia contains adipose tissue. Old healed fracture of the right eleventh rib. Lower lumbar spondylosis and degenerative disc disease causing impingement at L3-4, L4-5, and L5-S1. IMPRESSION: 1. Mild right hydronephrosis attributable to a 0.5 cm in long axis proximal ureteral calculus. 2. Multiple bilateral nonobstructive renal calculi. 3. Scattered left renal lesions varying density are present including some complex  but homogeneous lesions, the largest of which measures 2.9 cm in diameter with internal density of 42 Hounsfield units. When feasible, non emergent outpatient follow up renal protocol abdominal MRI with and without contrast is recommended for definitive characterization. 4. Cholelithiasis. 5. Substantial prostatomegaly. 6. Small right scrotal hydrocele. 7. Lower lumbar impingement. 8. Small left groin hernia contains adipose tissue. 9. Ascending thoracic aortic aneurysm 4.3 cm in diameter. Recommend annual imaging followup by CTA or MRA. This recommendation follows 2010 ACCF/AHA/AATS/ACR/ASA/SCA/SCAI/SIR/STS/SVM Guidelines for the Diagnosis and Management of Patients with Thoracic Aortic Disease. Circulation. 2010; 121:  Z610-R604. Aortic aneurysm NOS (ICD10-I71.9) 10.  Aortic Atherosclerosis (ICD10-I70.0). Electronically Signed   By: Freida Jes M.D.   On: 07/07/2023 18:36   Scheduled Meds:  acetaminophen   1,000 mg Oral Once   amLODipine   5 mg Oral Daily   aspirin  EC  81 mg Oral Q breakfast   atorvastatin   20 mg Oral Daily   carvedilol   12.5 mg Oral BID WC   heparin   5,000 Units Subcutaneous Q8H   tamsulosin   0.4 mg Oral QPC supper   Continuous Infusions:   LOS: 1 day   Time spent:  Haydee Lipa, DO [facility]  If 7PM-7AM, please contact night-coverage [email or web address]  07/09/2023, 7:19 AM

## 2023-07-09 NOTE — Op Note (Signed)
 Operative Note  Preoperative diagnosis:  1.  Right ureteral stone 2. R hydronephrosis 3. AKI  Postoperative diagnosis: Same 4. Urethral papillary lesion  Procedure(s): 1.  Cystoscopy 2. Right  retrograde pyelogram with interpretation 3. Right  ureteral stent placement 4. Fluoroscopy <1 hour with intraoperative interpretation  Surgeon: Julene Oaks, MD  Assistants:  None  Anesthesia:  General  Complications:  None  EBL:  minimal  Specimens: 1. none  Drains/Catheters: 1.  Right 6Fr x 26cm ureteral stent - this size was chosen due to some ureteral tortuosity, in future cases 6x24 would likely size better  Intraoperative findings:   Cystoscopy demonstrated a papillary lesion on the veru. I called and discussed possibly biopsy with wife and daughter, but ultimately deferred as he is on plavix  Right Retrograde pyelogram demonstrated hydronephrosis with some ureteral tortuosity. No filling defects Successful right ureteral stent placement with curl in the renal pelvis and bladder respectively.  Indication:  John Joseph is a 78 y.o. male with the above dx. After reviewing the management options for treatment, he elected to proceed with the above surgical procedure(s). We have discussed the potential benefits and risks of the procedure, side effects of the proposed treatment, the likelihood of the patient achieving the goals of the procedure, and any potential problems that might occur during the procedure or recuperation. Informed consent has been obtained.  Description of procedure: The patient was taken to the operating room and general anesthesia was induced.  The patient was placed in the dorsal lithotomy position, prepped and draped in the usual sterile fashion, and preoperative antibiotics were administered. A preoperative time-out was performed.   Cystourethroscopy was performed.  The patient's urethra was examined - there was a small, suspicious appearing papillary lesion  as described above.  I called and spoke with both the patient's wife and daughter about possible biopsy.  Ultimately as he is on Plavix  we felt it safer to defer any sort of procedure at this time.  There was also bilobar enlargement of his bladder. The bladder was then systematically examined in its entirety. There was no evidence for any bladder tumors, stones, or other mucosal pathology.    Attention then turned to the right ureteral orifice. A 0.038 zip wire was passed through the right orifice and over the wire a 5 Fr open ended catheter was inserted and passed up to the level of the renal pelvis. Omnipaque contrast was injected through the ureteral catheter and a retrograde pyelogram was performed with findings as dictated above. The wire was then replaced and the open ended catheter was removed.   A 6Fr x 26cm ureteral stent was advance over the wire. The stent was positioned appropriately under fluoroscopic and cystoscopic guidance.  The wire was then removed with an adequate stent curl noted in the renal pelvis as well as in the bladder.  The bladder was then emptied and the procedure ended.  The patient appeared to tolerate the procedure well and without complications.  The patient was able to be awakened and transferred to the recovery unit in satisfactory condition.   Plan:   Return to floor, okay to have diet.  Will arrange for ureteroscopy to address stones and biopsy of urethral papillary lesion with clearance to hold the Plavix   Mildred All MD [facility]  Pager: [phone]

## 2023-07-09 NOTE — Plan of Care (Signed)
 Patient alert to self and place. Patient frequently getting up to use the bathroom and had episode of incontinence. Bed locked and in low position. Call light given and within reach.  Comfort measures provided.  Problem: Clinical Measurements: Goal: Will remain free from infection Outcome: Progressing   Problem: Safety: Goal: Ability to remain free from injury will improve Outcome: Progressing

## 2023-07-09 NOTE — Progress Notes (Signed)
 Did ambulating O2 sat in the hallway with Taiwo because he has been having exertional dyspnea accompanied by mild wheezing when ambulating to/from bathroom.   We walked 50 feet; initially at rest he was 95%, dropped to 92% while walking, followed by multiple stumbles that required me to steady him saying he felt "faint" and lightheaded. He stated he also feels this way when he gets up at home.

## 2023-07-09 NOTE — Anesthesia Postprocedure Evaluation (Signed)
 Anesthesia Post Note  Patient: John Joseph  Procedure(s) Performed: CYSTOSCOPY, WITH RETROGRADE PYELOGRAM AND URETERAL STENT INSERTION (Right: Ureter)     Patient location during evaluation: PACU Anesthesia Type: General Level of consciousness: awake and alert Pain management: pain level controlled Vital Signs Assessment: post-procedure vital signs reviewed and stable Respiratory status: spontaneous breathing, nonlabored ventilation, respiratory function stable and patient connected to nasal cannula oxygen Cardiovascular status: blood pressure returned to baseline and stable Postop Assessment: no apparent nausea or vomiting Anesthetic complications: no  No notable events documented.  Last Vitals:  Vitals:   07/09/23 1145 07/09/23 1154  BP: (!) 151/91   Pulse: 69 66  Resp: 16 12  Temp:    SpO2: 95% 95%    Last Pain:  Vitals:   07/09/23 1154  TempSrc:   PainSc: 0-No pain                 John Joseph,W. EDMOND

## 2023-07-09 NOTE — Anesthesia Procedure Notes (Signed)
 Procedure Name: LMA Insertion Date/Time: 07/09/2023 10:38 AM  Performed by: Virgil Griffiths, CRNAPre-anesthesia Checklist: Patient identified, Emergency Drugs available, Suction available and Patient being monitored Patient Re-evaluated:Patient Re-evaluated prior to induction Oxygen Delivery Method: Circle System Utilized Preoxygenation: Pre-oxygenation with 100% oxygen Induction Type: IV induction Ventilation: Mask ventilation without difficulty LMA: LMA inserted LMA Size: 4.0 Number of attempts: 1 Airway Equipment and Method: Bite block Placement Confirmation: positive ETCO2 Tube secured with: Tape Dental Injury: Teeth and Oropharynx as per pre-operative assessment

## 2023-07-10 ENCOUNTER — Encounter (HOSPITAL_COMMUNITY): Payer: Self-pay | Admitting: Urology

## 2023-07-10 DIAGNOSIS — N200 Calculus of kidney: Secondary | ICD-10-CM | POA: Diagnosis not present

## 2023-07-10 LAB — BASIC METABOLIC PANEL WITH GFR
Anion gap: 10 (ref 5–15)
BUN: 30 mg/dL — ABNORMAL HIGH (ref 8–23)
CO2: 26 mmol/L (ref 22–32)
Calcium: 9.5 mg/dL (ref 8.9–10.3)
Chloride: 105 mmol/L (ref 98–111)
Creatinine, Ser: 2.4 mg/dL — ABNORMAL HIGH (ref 0.61–1.24)
GFR, Estimated: 27 mL/min — ABNORMAL LOW (ref >60)
Glucose, Bld: 147 mg/dL — ABNORMAL HIGH (ref 70–99)
Potassium: 4 mmol/L (ref 3.5–5.1)
Sodium: 141 mmol/L (ref 135–145)

## 2023-07-10 MED ORDER — ATORVASTATIN CALCIUM 20 MG PO TABS: 20.0000 mg | ORAL_TABLET | Freq: Every day | ORAL | 0 refills | Status: AC

## 2023-07-10 MED ORDER — AMLODIPINE BESYLATE 10 MG PO TABS: 10.0000 mg | ORAL_TABLET | Freq: Every day | ORAL | 0 refills | Status: AC

## 2023-07-10 MED ORDER — TAMSULOSIN HCL 0.4 MG PO CAPS: 0.4000 mg | ORAL_CAPSULE | Freq: Every day | ORAL | 0 refills | Status: AC

## 2023-07-10 NOTE — Discharge Summary (Signed)
 Physician Discharge Summary  John Joseph ZOX:[id number] DOB: 1945/09/03 DOA: 07/07/2023  PCP: Olin Bertin, MD  Admit date: 07/07/2023 Discharge date: 07/10/2023  Admitted From: Home Disposition: Home  Recommendations for Outpatient Follow-up:  Follow up with PCP in 1-2 weeks Follow-up with urology as scheduled in 1 to 2 weeks Follow-up with cardiology for discussion on Plavix  continuation during procedures  Home Health: None Equipment/Devices: None  Discharge Condition: Stable CODE STATUS: Full Diet recommendation: Low-salt low-fat low-carb diet  Brief/Interim Summary: John Joseph is a 78 y.o. male with medical history significant of hypertension who presents to the emergency department due to left-sided flank pain which started on the night of 07/06/2023 and was associated with nausea and vomiting.  CT abdomen and pelvis confirms right hydronephrosis with 0.5 cm proximal ureteral calculus.  Hospitalist called for admission, urology called in consult and recommended transfer to [facility] for intervention.  Patient admitted as above with acute proximal ureteral calculus with obstructive uropathy and AKI.  Underwent stent placement on 07/09/2023.  Plavix  hold for this procedure but ultimately given timing biopsy was deferred for outpatient follow-up.  Given improvement in creatinine mental status and otherwise back to baseline he is stable for discharge home.  Discussed with family at bedside, wife and daughter, that given improvement in renal function, urine output and mental status he is cleared from my standpoint, urology will follow him in the outpatient setting for discussion about stent removal and biopsy.  Discharge Diagnoses:  Principal Problem:   Nephrolithiasis Active Problems:   Hypertensive urgency   Acute kidney injury superimposed on stage 4 chronic kidney disease (HCC)   Essential hypertension   History of CVA (cerebrovascular accident)   Obesity, Class I, BMI  30-34.9   Nausea & vomiting    Discharge Instructions  Discharge Instructions     Call MD for:  difficulty breathing, headache or visual disturbances   Complete by: As directed    Call MD for:  extreme fatigue   Complete by: As directed    Call MD for:  hives   Complete by: As directed    Call MD for:  persistant dizziness or light-headedness   Complete by: As directed    Call MD for:  persistant nausea and vomiting   Complete by: As directed    Call MD for:  severe uncontrolled pain   Complete by: As directed    Call MD for:  temperature >100.4   Complete by: As directed    Diet - low sodium heart healthy   Complete by: As directed    Increase activity slowly   Complete by: As directed       Allergies as of 07/10/2023       Reactions   Iodinated Contrast Media Other (See Comments)   Convulsions    Prednisone Other (See Comments)   Debilitated walking Unable to walk   Amlodipine  Swelling   Leg edema   Cephalexin Other (See Comments)   Abdominal cramps, headache, breaks out in sweat   Losartan Potassium Other (See Comments)   Headaches   Lisinopril Other (See Comments)   Flushing on face   Tadalafil Other (See Comments)   Headache        Medication List     TAKE these medications    amLODipine  10 MG tablet Commonly known as: NORVASC  Take 1 tablet (10 mg total) by mouth daily. What changed:  medication strength how much to take   atorvastatin  20 MG tablet Commonly known as: LIPITOR   Take 1 tablet (20 mg total) by mouth daily. Start taking on: Jul 11, 2023   carvedilol  12.5 MG tablet Commonly known as: COREG  Take 1 tablet (12.5 mg total) by mouth 2 (two) times daily with a meal.   clopidogrel  75 MG tablet Commonly known as: PLAVIX  Take 75 mg by mouth daily.   colchicine 0.6 MG tablet Take 0.6 mg by mouth daily as needed (gout).   tamsulosin  0.4 MG Caps capsule Commonly known as: FLOMAX  Take 1 capsule (0.4 mg total) by mouth daily after  supper.        Allergies  Allergen Reactions   Iodinated Contrast Media Other (See Comments)    Convulsions    Prednisone Other (See Comments)    Debilitated walking Unable to walk   Amlodipine  Swelling    Leg edema   Cephalexin Other (See Comments)    Abdominal cramps, headache, breaks out in sweat   Losartan Potassium Other (See Comments)    Headaches   Lisinopril Other (See Comments)    Flushing on face   Tadalafil Other (See Comments)    Headache    Consultations: Urology  Procedures/Studies: DG C-Arm 1-60 Min-No Report Result Date: 07/09/2023 Fluoroscopy was utilized by the requesting physician.  No radiographic interpretation.   CT Renal Stone Study Result Date: 07/07/2023 CLINICAL DATA:  Bilateral flank pain for 3 days. EXAM: CT ABDOMEN AND PELVIS WITHOUT CONTRAST TECHNIQUE: Multidetector CT imaging of the abdomen and pelvis was performed following the standard protocol without IV contrast. RADIATION DOSE REDUCTION: This exam was performed according to the departmental dose-optimization program which includes automated exposure control, adjustment of the mA and/or kV according to patient size and/or use of iterative reconstruction technique. COMPARISON:  11/13/2021 FINDINGS: Lower chest: Ascending thoracic aortic aneurysm 4.3 cm in diameter. Descending thoracic aortic atherosclerosis. Upper normal heart size. Atelectasis noted along both hemidiaphragms. Hepatobiliary: 1.1 cm gallstone in the gallbladder. Pancreas: Unremarkable Spleen: Unremarkable Adrenals/Urinary Tract: Both adrenal glands appear normal. Postoperative findings anteriorly along the right kidney lower pole. There is mild right hydronephrosis attributable to a 0.5 cm in long axis proximal ureteral calculus shown on image 50 of series 2. The right ureter distal to this calculus is of normal caliber. Over 10 nonobstructive right renal calculi are present measuring up to 7 mm in diameter. Around 9 nonobstructive  left renal calculi are present measuring up to 6 mm in long axis. Scattered left renal lesions varying density are present including some complex but homogeneous lesions, the largest of which measures 2.9 cm in diameter with internal density of 42 Hounsfield units. Urinary bladder unremarkable. Stomach/Bowel: Sigmoid colon diverticulosis. Vascular/Lymphatic: Atherosclerosis is present, including aortoiliac atherosclerotic disease. Reproductive: Substantial prostatomegaly. Small right scrotal hydrocele. Other: No supplemental non-categorized findings. Musculoskeletal: Small left groin hernia contains adipose tissue. Old healed fracture of the right eleventh rib. Lower lumbar spondylosis and degenerative disc disease causing impingement at L3-4, L4-5, and L5-S1. IMPRESSION: 1. Mild right hydronephrosis attributable to a 0.5 cm in long axis proximal ureteral calculus. 2. Multiple bilateral nonobstructive renal calculi. 3. Scattered left renal lesions varying density are present including some complex but homogeneous lesions, the largest of which measures 2.9 cm in diameter with internal density of 42 Hounsfield units. When feasible, non emergent outpatient follow up renal protocol abdominal MRI with and without contrast is recommended for definitive characterization. 4. Cholelithiasis. 5. Substantial prostatomegaly. 6. Small right scrotal hydrocele. 7. Lower lumbar impingement. 8. Small left groin hernia contains adipose tissue. 9. Ascending thoracic aortic aneurysm  4.3 cm in diameter. Recommend annual imaging followup by CTA or MRA. This recommendation follows 2010 ACCF/AHA/AATS/ACR/ASA/SCA/SCAI/SIR/STS/SVM Guidelines for the Diagnosis and Management of Patients with Thoracic Aortic Disease. Circulation. 2010; 121: W098-J191. Aortic aneurysm NOS (ICD10-I71.9) 10.  Aortic Atherosclerosis (ICD10-I70.0). Electronically Signed   By: Freida Jes M.D.   On: 07/07/2023 18:36     Subjective: No acute issues or  events overnight denies nausea vomit diarrhea constipation headache fever or chest pain   Discharge Exam: Vitals:   07/10/23 0413 07/10/23 0413  BP: (!) 165/96 (!) 166/101  Pulse: 74 66  Resp: 20 20  Temp: 98.4 F (36.9 C) 98.4 F (36.9 C)  SpO2: 97% 97%   Vitals:   07/09/23 1221 07/09/23 2050 07/10/23 0413 07/10/23 0413  BP: (!) 142/85 (!) 144/79 (!) 165/96 (!) 166/101  Pulse: 63 70 74 66  Resp: 16 20 20 20   Temp:  98.4 F (36.9 C) 98.4 F (36.9 C) 98.4 F (36.9 C)  TempSrc:  Oral Oral Oral  SpO2: 98% 96% 97% 97%  Weight:      Height:        General: Pt is alert, awake, not in acute distress Cardiovascular: RRR, S1/S2 +, no rubs, no gallops Respiratory: CTA bilaterally, no wheezing, no rhonchi Abdominal: Soft, NT, ND, bowel sounds + Extremities: no edema, no cyanosis   The results of significant diagnostics from this hospitalization (including imaging, microbiology, ancillary and laboratory) are listed below for reference.     Microbiology: No results found for this or any previous visit (from the past 240 hours).   Labs: BNP (last 3 results) No results for input(s): "BNP" in the last 8760 hours. Basic Metabolic Panel: Recent Labs  Lab 07/07/23 1514 07/07/23 1535 07/08/23 0226 07/09/23 0626 07/10/23 0955  NA 136 138 135 137 141  K 4.2 4.3 3.9 4.5 4.0  CL 100 101 104 103 105  CO2 26  --  24 27 26   GLUCOSE 108* 108* 110* 96 147*  BUN 22 21 22  35* 30*  CREATININE 2.22* 2.40* 2.91* 3.79* 2.40*  CALCIUM  9.0  --  8.5* 9.0 9.5  MG  --   --  1.9  --   --   PHOS  --   --  3.6 3.9  --    Liver Function Tests: Recent Labs  Lab 07/07/23 1514 07/08/23 0226 07/09/23 0626  AST 18 19  --   ALT 13 12  --   ALKPHOS 89 80  --   BILITOT 1.1 1.1  --   PROT 6.9 6.1*  --   ALBUMIN 3.6 3.2* 3.4*   Recent Labs  Lab 07/07/23 1514  LIPASE 37   No results for input(s): "AMMONIA" in the last 168 hours. CBC: Recent Labs  Lab 07/07/23 1514 07/07/23 1535  07/08/23 0226  WBC 10.6*  --  10.2  NEUTROABS 8.6*  --   --   HGB 15.1 15.0 13.3  HCT 43.7 44.0 40.3  MCV 88.5  --  89.6  PLT 270  --  219   Cardiac Enzymes: No results for input(s): "CKTOTAL", "CKMB", "CKMBINDEX", "TROPONINI" in the last 168 hours. BNP: Invalid input(s): "POCBNP" CBG: No results for input(s): "GLUCAP" in the last 168 hours. D-Dimer No results for input(s): "DDIMER" in the last 72 hours. Hgb A1c No results for input(s): "HGBA1C" in the last 72 hours. Lipid Profile No results for input(s): "CHOL", "HDL", "LDLCALC", "TRIG", "CHOLHDL", "LDLDIRECT" in the last 72 hours. Thyroid  function studies No results for  input(s): "TSH", "T4TOTAL", "T3FREE", "THYROIDAB" in the last 72 hours.  Invalid input(s): "FREET3" Anemia work up No results for input(s): "VITAMINB12", "FOLATE", "FERRITIN", "TIBC", "IRON", "RETICCTPCT" in the last 72 hours. Urinalysis    Component Value Date/Time   COLORURINE STRAW (A) 07/07/2023 1400   APPEARANCEUR CLEAR 07/07/2023 1400   LABSPEC 1.010 07/07/2023 1400   PHURINE 7.0 07/07/2023 1400   GLUCOSEU NEGATIVE 07/07/2023 1400   HGBUR NEGATIVE 07/07/2023 1400   BILIRUBINUR NEGATIVE 07/07/2023 1400   KETONESUR NEGATIVE 07/07/2023 1400   PROTEINUR 100 (A) 07/07/2023 1400   NITRITE NEGATIVE 07/07/2023 1400   LEUKOCYTESUR NEGATIVE 07/07/2023 1400   Sepsis Labs Recent Labs  Lab 07/07/23 1514 07/08/23 0226  WBC 10.6* 10.2   Microbiology No results found for this or any previous visit (from the past 240 hours).   Time coordinating discharge: Over 30 minutes  SIGNED:   Haydee Lipa, DO [facility] 07/10/2023, 12:14 PM Pager   If 7PM-7AM, please contact night-coverage [email or web address]

## 2023-07-14 DIAGNOSIS — N179 Acute kidney failure, unspecified: Secondary | ICD-10-CM | POA: Diagnosis not present

## 2023-07-27 DIAGNOSIS — H9190 Unspecified hearing loss, unspecified ear: Secondary | ICD-10-CM | POA: Diagnosis not present

## 2023-07-27 DIAGNOSIS — N200 Calculus of kidney: Secondary | ICD-10-CM | POA: Diagnosis not present

## 2023-08-05 ENCOUNTER — Telehealth: Payer: Self-pay

## 2023-08-05 DIAGNOSIS — N1831 Chronic kidney disease, stage 3a: Secondary | ICD-10-CM

## 2023-08-16 ENCOUNTER — Telehealth: Payer: Self-pay | Admitting: *Deleted

## 2023-08-16 NOTE — Progress Notes (Unsigned)
 Complex Care Management Note Care Guide Note  08/16/2023 Name: John Joseph MRN: [id number] DOB: [birth year]   Complex Care Management Outreach Attempts: An unsuccessful telephone outreach was attempted today to offer the patient information about available complex care management services.  Follow Up Plan:  Additional outreach attempts will be made to offer the patient complex care management information and services.   Encounter Outcome:  No Answer  Harlene Satterfield  [facility]  [facility], [facility] Guide  Direct Dial: [phone]  Fax [phone]

## 2023-08-17 NOTE — Progress Notes (Unsigned)
 Complex Care Management Note Care Guide Note  08/17/2023 Name: Samie Reasons MRN: [id number] DOB: March 10, 1945   Complex Care Management Outreach Attempts: A second unsuccessful outreach was attempted today to offer the patient with information about available complex care management services.  Follow Up Plan:  Additional outreach attempts will be made to offer the patient complex care management information and services.   Encounter Outcome:  No Answer  Harlene Satterfield  [facility]  [facility], [facility] Guide  Direct Dial: [phone]  Fax [phone]

## 2023-08-18 NOTE — Progress Notes (Signed)
 Complex Care Management Note Care Guide Note  08/18/2023 Name: Carleton Vanvalkenburgh MRN: [id number] DOB: 1945/06/24   Complex Care Management Outreach Attempts: A third unsuccessful outreach was attempted today to offer the patient with information about available complex care management services.  Follow Up Plan:  No further outreach attempts will be made at this time. We have been unable to contact the patient to offer or enroll patient in complex care management services.  Encounter Outcome:  No Answer  Harlene Satterfield  [facility]  [facility], [facility] Guide  Direct Dial: [phone]  Fax [phone]

## 2023-08-24 DIAGNOSIS — N201 Calculus of ureter: Secondary | ICD-10-CM | POA: Diagnosis not present

## 2023-11-22 DIAGNOSIS — R42 Dizziness and giddiness: Secondary | ICD-10-CM | POA: Diagnosis not present

## 2023-11-22 DIAGNOSIS — W0110XA Fall on same level from slipping, tripping and stumbling with subsequent striking against unspecified object, initial encounter: Secondary | ICD-10-CM | POA: Diagnosis not present

## 2023-11-22 DIAGNOSIS — S299XXA Unspecified injury of thorax, initial encounter: Secondary | ICD-10-CM | POA: Diagnosis not present

## 2023-11-22 DIAGNOSIS — S0990XA Unspecified injury of head, initial encounter: Secondary | ICD-10-CM | POA: Diagnosis not present

## 2023-11-22 DIAGNOSIS — Z7902 Long term (current) use of antithrombotics/antiplatelets: Secondary | ICD-10-CM | POA: Diagnosis not present

## 2023-11-22 DIAGNOSIS — Z7901 Long term (current) use of anticoagulants: Secondary | ICD-10-CM | POA: Diagnosis not present

## 2023-11-22 DIAGNOSIS — S0101XA Laceration without foreign body of scalp, initial encounter: Secondary | ICD-10-CM | POA: Diagnosis not present

## 2023-11-22 DIAGNOSIS — E785 Hyperlipidemia, unspecified: Secondary | ICD-10-CM | POA: Diagnosis not present

## 2023-11-22 DIAGNOSIS — G459 Transient cerebral ischemic attack, unspecified: Secondary | ICD-10-CM | POA: Diagnosis not present
